# Patient Record
Sex: Male | Born: 1950 | Race: White | Hispanic: No | Marital: Married | Smoking: Former smoker
Health system: Southern US, Community
[De-identification: ages and names within clinical notes are randomized; demographics above are authoritative.]

## PROBLEM LIST (undated history)

## (undated) DIAGNOSIS — U071 COVID-19: Secondary | ICD-10-CM

## (undated) DIAGNOSIS — E785 Hyperlipidemia, unspecified: Secondary | ICD-10-CM

## (undated) DIAGNOSIS — H269 Unspecified cataract: Secondary | ICD-10-CM

## (undated) DIAGNOSIS — K317 Polyp of stomach and duodenum: Secondary | ICD-10-CM

## (undated) DIAGNOSIS — K219 Gastro-esophageal reflux disease without esophagitis: Secondary | ICD-10-CM

## (undated) DIAGNOSIS — N529 Male erectile dysfunction, unspecified: Secondary | ICD-10-CM

## (undated) DIAGNOSIS — L02211 Cutaneous abscess of abdominal wall: Secondary | ICD-10-CM

## (undated) DIAGNOSIS — Z862 Personal history of diseases of the blood and blood-forming organs and certain disorders involving the immune mechanism: Secondary | ICD-10-CM

## (undated) DIAGNOSIS — M1712 Unilateral primary osteoarthritis, left knee: Secondary | ICD-10-CM

## (undated) DIAGNOSIS — G4730 Sleep apnea, unspecified: Secondary | ICD-10-CM

## (undated) DIAGNOSIS — E119 Type 2 diabetes mellitus without complications: Secondary | ICD-10-CM

## (undated) DIAGNOSIS — K5790 Diverticulosis of intestine, part unspecified, without perforation or abscess without bleeding: Secondary | ICD-10-CM

## (undated) DIAGNOSIS — G4733 Obstructive sleep apnea (adult) (pediatric): Secondary | ICD-10-CM

## (undated) DIAGNOSIS — I77811 Abdominal aortic ectasia: Secondary | ICD-10-CM

## (undated) HISTORY — DX: Male erectile dysfunction, unspecified: N52.9

## (undated) HISTORY — DX: Polyp of stomach and duodenum: K31.7

## (undated) HISTORY — DX: Sleep apnea, unspecified: G47.30

## (undated) HISTORY — DX: Personal history of diseases of the blood and blood-forming organs and certain disorders involving the immune mechanism: Z86.2

## (undated) HISTORY — DX: Diverticulosis of intestine, part unspecified, without perforation or abscess without bleeding: K57.90

## (undated) HISTORY — DX: Unilateral primary osteoarthritis, left knee: M17.12

## (undated) HISTORY — DX: Gastro-esophageal reflux disease without esophagitis: K21.9

## (undated) HISTORY — PX: ABDOMINOPLASTY: SUR9

## (undated) HISTORY — DX: Abdominal aortic ectasia: I77.811

## (undated) HISTORY — DX: Unspecified cataract: H26.9

## (undated) HISTORY — DX: Hyperlipidemia, unspecified: E78.5

## (undated) HISTORY — PX: EYE SURGERY: SHX253

## (undated) HISTORY — PX: COSMETIC SURGERY: SHX468

## (undated) HISTORY — DX: Cutaneous abscess of abdominal wall: L02.211

## (undated) HISTORY — PX: HERNIA REPAIR: SHX51

## (undated) HISTORY — DX: Obstructive sleep apnea (adult) (pediatric): G47.33

## (undated) HISTORY — DX: COVID-19: U07.1

## (undated) HISTORY — PX: OTHER SURGICAL HISTORY: SHX169

## (undated) HISTORY — PX: TONSILLECTOMY: SUR1361

---

## 2011-01-04 DIAGNOSIS — IMO0002 Reserved for concepts with insufficient information to code with codable children: Secondary | ICD-10-CM | POA: Insufficient documentation

## 2011-01-04 DIAGNOSIS — E785 Hyperlipidemia, unspecified: Secondary | ICD-10-CM | POA: Insufficient documentation

## 2013-11-19 DIAGNOSIS — E1165 Type 2 diabetes mellitus with hyperglycemia: Secondary | ICD-10-CM | POA: Insufficient documentation

## 2013-11-19 DIAGNOSIS — IMO0002 Reserved for concepts with insufficient information to code with codable children: Secondary | ICD-10-CM | POA: Insufficient documentation

## 2013-11-20 DIAGNOSIS — E669 Obesity, unspecified: Secondary | ICD-10-CM | POA: Insufficient documentation

## 2017-06-03 HISTORY — PX: COLONOSCOPY: SHX174

## 2017-11-21 ENCOUNTER — Emergency Department (HOSPITAL_BASED_OUTPATIENT_CLINIC_OR_DEPARTMENT_OTHER): Payer: Managed Care, Other (non HMO)

## 2017-11-21 ENCOUNTER — Other Ambulatory Visit: Payer: Self-pay

## 2017-11-21 ENCOUNTER — Emergency Department (HOSPITAL_BASED_OUTPATIENT_CLINIC_OR_DEPARTMENT_OTHER)
Admission: EM | Admit: 2017-11-21 | Discharge: 2017-11-21 | Disposition: A | Discharge status: Home / Self Care | Payer: Managed Care, Other (non HMO) | Attending: Emergency Medicine | Admitting: Emergency Medicine

## 2017-11-21 ENCOUNTER — Encounter (HOSPITAL_BASED_OUTPATIENT_CLINIC_OR_DEPARTMENT_OTHER): Payer: Self-pay | Admitting: Emergency Medicine

## 2017-11-21 DIAGNOSIS — Y999 Unspecified external cause status: Secondary | ICD-10-CM | POA: Diagnosis not present

## 2017-11-21 DIAGNOSIS — S7012XA Contusion of left thigh, initial encounter: Secondary | ICD-10-CM | POA: Diagnosis not present

## 2017-11-21 DIAGNOSIS — M79605 Pain in left leg: Secondary | ICD-10-CM

## 2017-11-21 DIAGNOSIS — Y939 Activity, unspecified: Secondary | ICD-10-CM | POA: Insufficient documentation

## 2017-11-21 DIAGNOSIS — S79922A Unspecified injury of left thigh, initial encounter: Secondary | ICD-10-CM | POA: Diagnosis present

## 2017-11-21 DIAGNOSIS — E119 Type 2 diabetes mellitus without complications: Secondary | ICD-10-CM | POA: Diagnosis not present

## 2017-11-21 DIAGNOSIS — Y929 Unspecified place or not applicable: Secondary | ICD-10-CM | POA: Diagnosis not present

## 2017-11-21 DIAGNOSIS — Y33XXXA Other specified events, undetermined intent, initial encounter: Secondary | ICD-10-CM | POA: Diagnosis not present

## 2017-11-21 HISTORY — DX: Type 2 diabetes mellitus without complications: E11.9

## 2017-11-21 NOTE — Discharge Instructions (Addendum)
May use ibuprofen, naproxen, or Tylenol for pain. Keep the extremity elevated as often as possible. Apply warm compresses to the area throughout the day. Follow-up with a primary care provider for any further management of this issue.

## 2017-11-21 NOTE — ED Triage Notes (Signed)
L inner thigh pain x 3days. Denies injury.

## 2017-11-21 NOTE — ED Provider Notes (Signed)
[facility] EMERGENCY DEPARTMENT Provider Note   CSN: [id number] Arrival date & time: 11/21/17  1436     History   Chief Complaint Chief Complaint  Patient presents with  . Leg Pain    HPI Rosbel Buckner is a 67 y.o. male.  HPI   Rayhaan Huster is a 67 y.o. male, with a history of DM, presenting to the ED with an area of pain to the left distal inner thigh for about the past 3 days.  States, "maybe it is worsening or maybe I am just noticing it more now that I know about it.  I am not really worried about it, but my wife told me to come in."  Pain is minor, described as a soreness, nonradiating.  Denies fever, numbness, weakness, injury, chest pain, shortness of breath, other areas of bruising or pain, or any other complaints.  States he does frequently travel by air typically seated for 2 to 4 hours at a time.  Denies history of PE/DVT, recent trauma, recent surgery, hormone therapy.   Past Medical History:  Diagnosis Date  . Diabetes mellitus without complication ([place])     There are no active problems to display for this patient.   Past Surgical History:  Procedure Laterality Date  . HERNIA REPAIR    . TONSILLECTOMY          Home Medications    Prior to Admission medications   Not on File    Family History No family history on file.  Social History Social History   Tobacco Use  . Smoking status: Never Smoker  . Smokeless tobacco: Never Used  Substance Use Topics  . Alcohol use: Yes  . Drug use: Not Currently     Allergies   Patient has no known allergies.   Review of Systems Review of Systems  Constitutional: Negative for chills, diaphoresis and fever.  Respiratory: Negative for shortness of breath.   Cardiovascular: Negative for chest pain and leg swelling.  Gastrointestinal: Negative for nausea and vomiting.  Musculoskeletal: Positive for myalgias.  Skin: Positive for color change.  Neurological: Negative for weakness and numbness.    All other systems reviewed and are negative.    Physical Exam Updated Vital Signs BP 132/89 (BP Location: Left Arm)   Pulse 76   Temp 98.3 F (36.8 C) (Oral)   Resp 16   Ht 5\' 10"  (1.778 m)   Wt 106.6 kg (235 lb)   SpO2 98%   BMI 33.72 kg/m   Physical Exam  Constitutional: He appears well-developed and well-nourished. No distress.  HENT:  Head: Normocephalic and atraumatic.  Eyes: Conjunctivae are normal.  Neck: Neck supple.  Cardiovascular: Normal rate, regular rhythm and intact distal pulses.  Pulmonary/Chest: Effort normal. No respiratory distress.  Abdominal: There is no guarding.  Musculoskeletal: He exhibits tenderness. He exhibits no edema.       Legs: Approximately 4-5 cm circular region of bruising, slight tenderness, and slight firmness to the left posterior medial distal thigh.  No noted increased warmth, erythema, or fluctuance. Full motor function intact without difficulty in the left knee and hip. Patient ambulatory without gait deficit.  Lymphadenopathy:    He has no cervical adenopathy.  Neurological: He is alert.  Skin: Skin is warm and dry. He is not diaphoretic. No pallor.  Psychiatric: He has a normal mood and affect. His behavior is normal.  Nursing note and vitals reviewed.    ED Treatments / Results  Labs (all labs ordered  are listed, but only abnormal results are displayed) Labs Reviewed - No data to display  EKG None  Radiology US Venous Img Lower  Left (dvt Study)  Result Date: 11/21/2017 CLINICAL DATA:  Left lower extremity inner thigh pain for 3 days. Tenderness and a palpable mass is associated with the symptomatic area. EXAM: LEFT LOWER EXTREMITY VENOUS DUPLEX ULTRASOUND TECHNIQUE: Doppler venous assessment of the left lower extremity deep venous system was performed, including characterization of spectral flow, compressibility, and phasicity. COMPARISON:  None. FINDINGS: There is complete compressibility of the left common femoral,  femoral, and popliteal veins. Doppler analysis demonstrates respiratory phasicity and augmentation of flow with calf compression. No obvious superficial vein or calf vein thrombosis. IMPRESSION: No evidence of left lower extremity DVT. Electronically Signed   By: Marybelle Killings M.D.   On: 11/21/2017 16:02    Procedures Procedures (including critical care time)  Medications Ordered in ED Medications - No data to display   Initial Impression / Assessment and Plan / ED Course  I have reviewed the triage vital signs and the nursing notes.  Pertinent labs & imaging results that were available during my care of the patient were reviewed by me and considered in my medical decision making (see chart for details).     Patient presents with an area of bruising to the left medial thigh.  Discomfort is minor.  Duplex ultrasound negative for DVT.  Symptomatic care discussed. PCP follow-up as needed. The patient was given instructions for home care as well as return precautions. Patient voices understanding of these instructions, accepts the plan, and is comfortable with discharge.    Findings and plan of care discussed with Shirlyn Goltz, MD. Dr. Darl Householder personally evaluated and examined this patient.  Final Clinical Impressions(s) / ED Diagnoses   Final diagnoses:  Left leg pain    ED Discharge Orders    None       Layla Maw 11/21/17 1715    Drenda Freeze, MD 11/21/17 [phone]

## 2018-03-11 ENCOUNTER — Emergency Department (HOSPITAL_COMMUNITY): Payer: Managed Care, Other (non HMO)

## 2018-03-11 ENCOUNTER — Inpatient Hospital Stay (HOSPITAL_COMMUNITY)
Admission: EM | Admit: 2018-03-11 | Discharge: 2018-03-13 | DRG: 419 | Disposition: A | Discharge status: Home / Self Care | Payer: Managed Care, Other (non HMO) | Attending: General Surgery | Admitting: General Surgery

## 2018-03-11 ENCOUNTER — Other Ambulatory Visit: Payer: Self-pay

## 2018-03-11 ENCOUNTER — Encounter (HOSPITAL_COMMUNITY): Payer: Self-pay | Admitting: Emergency Medicine

## 2018-03-11 DIAGNOSIS — R1011 Right upper quadrant pain: Secondary | ICD-10-CM | POA: Diagnosis not present

## 2018-03-11 DIAGNOSIS — K8000 Calculus of gallbladder with acute cholecystitis without obstruction: Principal | ICD-10-CM | POA: Diagnosis present

## 2018-03-11 DIAGNOSIS — K819 Cholecystitis, unspecified: Secondary | ICD-10-CM

## 2018-03-11 DIAGNOSIS — K82A1 Gangrene of gallbladder in cholecystitis: Secondary | ICD-10-CM | POA: Diagnosis present

## 2018-03-11 DIAGNOSIS — Z6834 Body mass index (BMI) 34.0-34.9, adult: Secondary | ICD-10-CM

## 2018-03-11 DIAGNOSIS — E669 Obesity, unspecified: Secondary | ICD-10-CM | POA: Diagnosis present

## 2018-03-11 DIAGNOSIS — K5900 Constipation, unspecified: Secondary | ICD-10-CM | POA: Diagnosis present

## 2018-03-11 DIAGNOSIS — K810 Acute cholecystitis: Secondary | ICD-10-CM | POA: Diagnosis present

## 2018-03-11 DIAGNOSIS — E860 Dehydration: Secondary | ICD-10-CM | POA: Diagnosis present

## 2018-03-11 DIAGNOSIS — E119 Type 2 diabetes mellitus without complications: Secondary | ICD-10-CM | POA: Diagnosis present

## 2018-03-11 LAB — CBC WITH DIFFERENTIAL/PLATELET
ABS IMMATURE GRANULOCYTES: 0.12 10*3/uL — AB (ref 0.00–0.07)
BASOS PCT: 0 %
Basophils Absolute: 0 10*3/uL (ref 0.0–0.1)
EOS ABS: 0 10*3/uL (ref 0.0–0.5)
EOS PCT: 0 %
HCT: 44.3 % (ref 39.0–52.0)
Hemoglobin: 14.3 g/dL (ref 13.0–17.0)
Immature Granulocytes: 1 %
Lymphocytes Relative: 6 %
Lymphs Abs: 1.2 10*3/uL (ref 0.7–4.0)
MCH: 26 pg (ref 26.0–34.0)
MCHC: 32.3 g/dL (ref 30.0–36.0)
MCV: 80.5 fL (ref 80.0–100.0)
MONO ABS: 1.4 10*3/uL — AB (ref 0.1–1.0)
Monocytes Relative: 7 %
NEUTROS ABS: 17.1 10*3/uL — AB (ref 1.7–7.7)
Neutrophils Relative %: 86 %
PLATELETS: 306 10*3/uL (ref 150–400)
RBC: 5.5 MIL/uL (ref 4.22–5.81)
RDW: 14.8 % (ref 11.5–15.5)
WBC: 19.8 10*3/uL — ABNORMAL HIGH (ref 4.0–10.5)
nRBC: 0 % (ref 0.0–0.2)

## 2018-03-11 LAB — LIPASE, BLOOD: LIPASE: 28 U/L (ref 11–51)

## 2018-03-11 LAB — COMPREHENSIVE METABOLIC PANEL
ALT: 25 U/L (ref 0–44)
AST: 23 U/L (ref 15–41)
Albumin: 4.2 g/dL (ref 3.5–5.0)
Alkaline Phosphatase: 70 U/L (ref 38–126)
Anion gap: 13 (ref 5–15)
BUN: 11 mg/dL (ref 8–23)
CHLORIDE: 99 mmol/L (ref 98–111)
CO2: 23 mmol/L (ref 22–32)
CREATININE: 0.78 mg/dL (ref 0.61–1.24)
Calcium: 9.6 mg/dL (ref 8.9–10.3)
GFR calc non Af Amer: >60 mL/min (ref >60)
Glucose, Bld: 163 mg/dL — ABNORMAL HIGH (ref 70–99)
POTASSIUM: 3.4 mmol/L — AB (ref 3.5–5.1)
Sodium: 135 mmol/L (ref 135–145)
Total Bilirubin: 1.3 mg/dL — ABNORMAL HIGH (ref 0.3–1.2)
Total Protein: 7.5 g/dL (ref 6.5–8.1)

## 2018-03-11 LAB — URINALYSIS, ROUTINE W REFLEX MICROSCOPIC
BILIRUBIN URINE: NEGATIVE
GLUCOSE, UA: NEGATIVE mg/dL
Hgb urine dipstick: NEGATIVE
KETONES UR: NEGATIVE mg/dL
Leukocytes, UA: NEGATIVE
Nitrite: NEGATIVE
PH: 6 (ref 5.0–8.0)
Protein, ur: NEGATIVE mg/dL
SPECIFIC GRAVITY, URINE: 1.018 (ref 1.005–1.030)

## 2018-03-11 LAB — PROTIME-INR
INR: 1.22
PROTHROMBIN TIME: 15.3 s — AB (ref 11.4–15.2)

## 2018-03-11 LAB — I-STAT CG4 LACTIC ACID, ED: LACTIC ACID, VENOUS: 1.62 mmol/L (ref 0.5–1.9)

## 2018-03-11 MED ORDER — IOHEXOL 300 MG/ML  SOLN
100.0000 mL | Freq: Once | INTRAMUSCULAR | Status: AC | PRN
  Administered 2018-03-11: 100 mL via INTRAVENOUS

## 2018-03-11 NOTE — ED Provider Notes (Signed)
[facility]   CSN: [id number] Arrival date & time: 03/11/18  4818     History   Chief Complaint Chief Complaint  Patient presents with  . Abdominal Pain    HPI Alwaleed Obeso is a 67 y.o. male.  Patient sent to the [facility] from primary care doctor's office for evaluation of fever and right upper quadrant abdominal pain.  Patient reports that symptoms began approximately 24 hours ago.  He has been having continuous right upper quadrant abdominal pain since it started.  He reports that he has had intermittent pains in the area over the years and was told by a doctor at some point that it might be his gallbladder, but never pursued it.     Past Medical History:  Diagnosis Date  . Diabetes mellitus without complication ([place])     There are no active problems to display for this patient.   Past Surgical History:  Procedure Laterality Date  . HERNIA REPAIR    . TONSILLECTOMY          Home Medications    Prior to Admission medications   Not on File    Family History No family history on file.  Social History Social History   Tobacco Use  . Smoking status: Never Smoker  . Smokeless tobacco: Never Used  Substance Use Topics  . Alcohol use: Yes  . Drug use: Not Currently     Allergies   Patient has no known allergies.   Review of Systems Review of Systems  Constitutional: Positive for chills and fever.  Gastrointestinal: Positive for abdominal pain.  All other systems reviewed and are negative.    Physical Exam Updated Vital Signs BP (!) 129/91 (BP Location: Left Arm)   Pulse (!) 116   Temp 99 F (37.2 C) (Oral)   Resp 18   Ht 5\' 10"  (1.778 m)   Wt 109.3 kg   SpO2 95%   BMI 34.58 kg/m   Physical Exam  Constitutional: He is oriented to person, place, and time. He appears well-developed and well-nourished. No distress.  HENT:  Head: Normocephalic and atraumatic.  Right Ear:  Hearing normal.  Left Ear: Hearing normal.  Nose: Nose normal.  Mouth/Throat: Oropharynx is clear and moist and mucous membranes are normal.  Eyes: Pupils are equal, round, and reactive to light. Conjunctivae and EOM are normal.  Neck: Normal range of motion. Neck supple.  Cardiovascular: Regular rhythm, S1 normal and S2 normal. Exam reveals no gallop and no friction rub.  No murmur heard. Pulmonary/Chest: Effort normal and breath sounds normal. No respiratory distress. He exhibits no tenderness.  Abdominal: Soft. Normal appearance and bowel sounds are normal. There is no hepatosplenomegaly. There is tenderness in the right upper quadrant. There is guarding and positive Murphy's sign. There is no rebound and no tenderness at McBurney's point. No hernia.  Musculoskeletal: Normal range of motion.  Neurological: He is alert and oriented to person, place, and time. He has normal strength. No cranial nerve deficit or sensory deficit. Coordination normal. GCS eye subscore is 4. GCS verbal subscore is 5. GCS motor subscore is 6.  Skin: Skin is warm, dry and intact. No rash noted. No cyanosis.  Psychiatric: He has a normal mood and affect. His speech is normal and behavior is normal. Thought content normal.  Nursing note and vitals reviewed.    ED Treatments / Results  Labs (all labs ordered are listed, but only abnormal results are displayed)  Labs Reviewed  COMPREHENSIVE METABOLIC PANEL - Abnormal; Notable for the following components:      Result Value   Potassium 3.4 (*)    Glucose, Bld 163 (*)    Total Bilirubin 1.3 (*)    All other components within normal limits  CBC WITH DIFFERENTIAL/PLATELET - Abnormal; Notable for the following components:   WBC 19.8 (*)    Neutro Abs 17.1 (*)    Monocytes Absolute 1.4 (*)    Abs Immature Granulocytes 0.12 (*)    All other components within normal limits  PROTIME-INR - Abnormal; Notable for the following components:   Prothrombin Time 15.3 (*)     All other components within normal limits  CULTURE, BLOOD (ROUTINE X 2)  CULTURE, BLOOD (ROUTINE X 2)  URINALYSIS, ROUTINE W REFLEX MICROSCOPIC  LIPASE, BLOOD  I-STAT CG4 LACTIC ACID, ED  I-STAT CG4 LACTIC ACID, ED    EKG None  Radiology Ct Abdomen Pelvis W Contrast  Result Date: 03/11/2018 CLINICAL DATA:  Right upper quadrant pain starting yesterday. Nausea, fever, loss of appetite, difficulty defecating. EXAM: CT ABDOMEN AND PELVIS WITH CONTRAST TECHNIQUE: Multidetector CT imaging of the abdomen and pelvis was performed using the standard protocol following bolus administration of intravenous contrast. CONTRAST:  15mL OMNIPAQUE IOHEXOL 300 MG/ML  SOLN COMPARISON:  None. FINDINGS: Lower chest: Linear atelectasis in the right lung base. Small esophageal hiatal hernia. Hepatobiliary: Mild diffuse fatty infiltration of the liver. Circumscribed low-attenuation lesion in segment 6 is likely a small cyst. Additional subcentimeter lesions too small to characterize, also likely cysts or hemangiomas. Cholelithiasis with several stones in the gallbladder. Gallbladder wall is thickened and edematous with mild infiltration in the surrounding fat consistent with acute cholecystitis. Bile ducts are not dilated. Pancreas: Unremarkable. No pancreatic ductal dilatation or surrounding inflammatory changes. Spleen: Normal in size without focal abnormality. Adrenals/Urinary Tract: Adrenal glands are unremarkable. Kidneys are normal, without renal calculi, focal lesion, or hydronephrosis. Bladder is unremarkable. Stomach/Bowel: Stomach and small bowel are mostly decompressed. Scattered stool throughout the colon. No colonic distention or wall thickening. Diverticulosis of the sigmoid colon without evidence of diverticulitis. Appendix is normal. Vascular/Lymphatic: Aortic atherosclerosis. No enlarged abdominal or pelvic lymph nodes. Reproductive: Prostate is unremarkable. Other: Postoperative changes consistent with  bilateral inguinal hernia repairs and anterior abdominal wall mesh. Small periumbilical hernias containing fat. No free air or free fluid in the abdomen. Musculoskeletal: Degenerative changes in the spine. No destructive bone lesions. IMPRESSION: Cholelithiasis with inflammatory changes in the gallbladder consistent with acute cholecystitis. Diffuse fatty infiltration of the liver. Probable hepatic cysts. Small esophageal hiatal hernia. Diverticulosis of the colon without evidence of diverticulitis. Small periumbilical hernia containing fat. Aortic Atherosclerosis (ICD10-I70.0). Electronically Signed   By: Lucienne Capers M.D.   On: 03/11/2018 23:05    Procedures Procedures (including critical care time)  Medications Ordered in ED Medications  sodium chloride 0.9 % bolus 1,000 mL (has no administration in time range)  cefTRIAXone (ROCEPHIN) 2 g in sodium chloride 0.9 % 100 mL IVPB (has no administration in time range)  iohexol (OMNIPAQUE) 300 MG/ML solution 100 mL (100 mLs Intravenous Contrast Given 03/11/18 2226)     Initial Impression / Assessment and Plan / ED Course  I have reviewed the triage vital signs and the nursing notes.  Pertinent labs & imaging results that were available during my care of the patient were reviewed by me and considered in my medical decision making (see chart for details).    Patient presents for evaluation of  abdominal pain.  Patient has been experiencing persistent right upper quadrant abdominal pain with fever for 24 hours.  Examination reveals tenderness in the right upper quadrant with guarding and a positive Murphy sign.  He does have significant leukocytosis.  LFTs, lipase are normal.  Patient afebrile here in the ER, but did have a temperature of 100.5 document of by his primary care doctor earlier today.  CT scan does show evidence of acute cholecystitis.  Patient is slightly tachycardic, but does not have any fever here in the ER and lactic acid is normal.   He was empirically treated with Rocephin 2 g IV.  Discussed with Dr. Brantley Stage, on call for general surgery. He will see patient.  Final Clinical Impressions(s) / ED Diagnoses   Final diagnoses:  Cholecystitis    ED Discharge Orders    None       Numair Masden, Gwenyth Allegra, MD 03/12/18 [phone]

## 2018-03-11 NOTE — ED Provider Notes (Signed)
MSE was initiated and I personally evaluated the patient and placed orders (if any) at  8:41 PM on March 11, 2018.  The patient appears stable so that the remainder of the MSE may be completed by another provider.  Patient placed in Quick Look pathway, seen and evaluated   Chief Complaint: Abdominal pain  HPI:   67 year old male with past medical history of diabetes presents to ED for evaluation of right upper quadrant abdominal pain that began yesterday.  States that he has had intermittent pain for the past several years and was told that it could be due to his gallbladder.  However, he has not pursued any further treatment.  Symptoms began yesterday after he ate some peanuts.  He reports nausea but denies any vomiting.  Reports fever with T-max 101 today.  Did not take any antipyretics.  Seen and evaluated by PCP earlier today and was sent for further evaluation and CT scan.  His last bowel movement was yesterday and he states that "that is constipation for me."  Denies any sick contacts with similar symptoms, urinary symptoms, chest pain, shortness of breath. PCP notes show tachycardia at 127, temperature 100.5 in the office.  He was told to come to the ED earlier today but EMS transport. Wanted his wife to drive him.  ROS: abdominal pain (one)  Physical Exam:   Gen: No distress  Neuro: Awake and Alert  Skin: Warm    Focused Exam: Tenderness palpation of the right upper quadrant without rebound or guarding noted. No CVA tenderness bilaterally. Tachycardic.  8:44 PM Informed RN that patient needs room soon due to leukocytosis in PCP office (16), fever, tachycardia and possible intraabdominal infection.  Initiation of care has begun. The patient has been counseled on the process, plan, and necessity for staying for the completion/evaluation, and the remainder of the medical screening examination    Delia Heady, PA-C 03/11/18 2045    Deno Etienne, DO 03/12/18 0003

## 2018-03-11 NOTE — ED Triage Notes (Signed)
Pt reports RUQ abd pain that started yesterday. Pt's doctor called and told him to come here because "the infection is up." Pt reports nausea, fevers, loss of appetite, and unable to defecate. Last BM yesterday. Fever of 101F earlier today. Pt reports not taking anything for fevers. 99.69F in triage.

## 2018-03-12 ENCOUNTER — Other Ambulatory Visit: Payer: Self-pay

## 2018-03-12 ENCOUNTER — Inpatient Hospital Stay (HOSPITAL_COMMUNITY): Payer: Managed Care, Other (non HMO) | Admitting: Certified Registered Nurse Anesthetist

## 2018-03-12 ENCOUNTER — Encounter (HOSPITAL_COMMUNITY): Payer: Self-pay | Admitting: Certified Registered Nurse Anesthetist

## 2018-03-12 ENCOUNTER — Encounter (HOSPITAL_COMMUNITY): Admission: EM | Disposition: A | Discharge status: Home / Self Care | Payer: Self-pay | Source: Home / Self Care

## 2018-03-12 DIAGNOSIS — E669 Obesity, unspecified: Secondary | ICD-10-CM | POA: Diagnosis present

## 2018-03-12 DIAGNOSIS — I951 Orthostatic hypotension: Secondary | ICD-10-CM

## 2018-03-12 DIAGNOSIS — R000 Tachycardia, unspecified: Secondary | ICD-10-CM | POA: Diagnosis not present

## 2018-03-12 DIAGNOSIS — R1011 Right upper quadrant pain: Secondary | ICD-10-CM | POA: Diagnosis present

## 2018-03-12 DIAGNOSIS — K82A1 Gangrene of gallbladder in cholecystitis: Secondary | ICD-10-CM | POA: Diagnosis present

## 2018-03-12 DIAGNOSIS — K810 Acute cholecystitis: Secondary | ICD-10-CM | POA: Diagnosis present

## 2018-03-12 DIAGNOSIS — E119 Type 2 diabetes mellitus without complications: Secondary | ICD-10-CM

## 2018-03-12 DIAGNOSIS — K5900 Constipation, unspecified: Secondary | ICD-10-CM | POA: Diagnosis present

## 2018-03-12 DIAGNOSIS — K8000 Calculus of gallbladder with acute cholecystitis without obstruction: Secondary | ICD-10-CM | POA: Diagnosis present

## 2018-03-12 DIAGNOSIS — E860 Dehydration: Secondary | ICD-10-CM | POA: Diagnosis present

## 2018-03-12 DIAGNOSIS — Z6834 Body mass index (BMI) 34.0-34.9, adult: Secondary | ICD-10-CM | POA: Diagnosis not present

## 2018-03-12 HISTORY — PX: CHOLECYSTECTOMY: SHX55

## 2018-03-12 HISTORY — PX: ABDOMINAL HERNIA REPAIR: SHX539

## 2018-03-12 LAB — LIPID PANEL
CHOLESTEROL: 152 mg/dL (ref 0–200)
HDL: 48 mg/dL (ref >40)
LDL Cholesterol: 86 mg/dL (ref 0–99)
Total CHOL/HDL Ratio: 3.2 RATIO
Triglycerides: 88 mg/dL (ref <150)
VLDL: 18 mg/dL (ref 0–40)

## 2018-03-12 LAB — GLUCOSE, CAPILLARY
GLUCOSE-CAPILLARY: 150 mg/dL — AB (ref 70–99)
GLUCOSE-CAPILLARY: 146 mg/dL — AB (ref 70–99)
GLUCOSE-CAPILLARY: 183 mg/dL — AB (ref 70–99)
Glucose-Capillary: 139 mg/dL — ABNORMAL HIGH (ref 70–99)
Glucose-Capillary: 147 mg/dL — ABNORMAL HIGH (ref 70–99)
Glucose-Capillary: 242 mg/dL — ABNORMAL HIGH (ref 70–99)

## 2018-03-12 LAB — SURGICAL PCR SCREEN
MRSA, PCR: NEGATIVE
Staphylococcus aureus: POSITIVE — AB

## 2018-03-12 LAB — HEMOGLOBIN A1C
HEMOGLOBIN A1C: 6.3 % — AB (ref 4.8–5.6)
MEAN PLASMA GLUCOSE: 134.11 mg/dL

## 2018-03-12 LAB — TROPONIN I
Troponin I: <0.03 ng/mL (ref <0.03)
Troponin I: <0.03 ng/mL (ref <0.03)

## 2018-03-12 LAB — HIV ANTIBODY (ROUTINE TESTING W REFLEX): HIV SCREEN 4TH GENERATION: NONREACTIVE

## 2018-03-12 SURGERY — LAPAROSCOPIC CHOLECYSTECTOMY WITH INTRAOPERATIVE CHOLANGIOGRAM
Anesthesia: General | Site: Abdomen

## 2018-03-12 MED ORDER — MUPIROCIN 2 % EX OINT
TOPICAL_OINTMENT | Freq: Two times a day (BID) | CUTANEOUS | Status: DC
  Administered 2018-03-12: 10:00:00 via NASAL
  Filled 2018-03-12: qty 22

## 2018-03-12 MED ORDER — KETOROLAC TROMETHAMINE 30 MG/ML IJ SOLN
INTRAMUSCULAR | Status: AC
  Filled 2018-03-12: qty 1

## 2018-03-12 MED ORDER — SODIUM CHLORIDE 0.9 % IV SOLN
2.0000 g | Freq: Once | INTRAVENOUS | Status: AC
  Administered 2018-03-12: 2 g via INTRAVENOUS
  Filled 2018-03-12: qty 20

## 2018-03-12 MED ORDER — LIDOCAINE 2% (20 MG/ML) 5 ML SYRINGE
INTRAMUSCULAR | Status: AC
  Filled 2018-03-12: qty 5

## 2018-03-12 MED ORDER — SODIUM CHLORIDE 0.9 % IR SOLN
Status: DC | PRN
  Administered 2018-03-12: 3000 mL
  Administered 2018-03-12 (×2): 1000 mL

## 2018-03-12 MED ORDER — SODIUM CHLORIDE 0.9 % IV BOLUS
1000.0000 mL | Freq: Once | INTRAVENOUS | Status: AC
  Administered 2018-03-12: 1000 mL via INTRAVENOUS

## 2018-03-12 MED ORDER — FENTANYL CITRATE (PF) 250 MCG/5ML IJ SOLN
INTRAMUSCULAR | Status: AC
  Filled 2018-03-12: qty 5

## 2018-03-12 MED ORDER — OXYCODONE HCL 5 MG PO TABS
5.0000 mg | ORAL_TABLET | ORAL | Status: DC | PRN
  Administered 2018-03-12 – 2018-03-13 (×3): 10 mg via ORAL
  Filled 2018-03-12 (×3): qty 2

## 2018-03-12 MED ORDER — ONDANSETRON 4 MG PO TBDP: 4.0000 mg | ORAL_TABLET | Freq: Four times a day (QID) | ORAL | Status: DC | PRN

## 2018-03-12 MED ORDER — MEPERIDINE HCL 50 MG/ML IJ SOLN: 6.2500 mg | INTRAMUSCULAR | Status: DC | PRN

## 2018-03-12 MED ORDER — ENOXAPARIN SODIUM 40 MG/0.4ML ~~LOC~~ SOLN: 40.0000 mg | SUBCUTANEOUS | Status: DC

## 2018-03-12 MED ORDER — PROPOFOL 10 MG/ML IV BOLUS
INTRAVENOUS | Status: DC | PRN
  Administered 2018-03-12: 200 mg via INTRAVENOUS

## 2018-03-12 MED ORDER — DEXAMETHASONE SODIUM PHOSPHATE 10 MG/ML IJ SOLN
INTRAMUSCULAR | Status: AC
  Filled 2018-03-12: qty 1

## 2018-03-12 MED ORDER — DIPHENHYDRAMINE HCL 50 MG/ML IJ SOLN: 25.0000 mg | Freq: Four times a day (QID) | INTRAMUSCULAR | Status: DC | PRN

## 2018-03-12 MED ORDER — PROPOFOL 10 MG/ML IV BOLUS
INTRAVENOUS | Status: AC
  Filled 2018-03-12: qty 20

## 2018-03-12 MED ORDER — ROCURONIUM BROMIDE 50 MG/5ML IV SOSY
PREFILLED_SYRINGE | INTRAVENOUS | Status: AC
  Filled 2018-03-12: qty 5

## 2018-03-12 MED ORDER — DEXAMETHASONE SODIUM PHOSPHATE 10 MG/ML IJ SOLN
INTRAMUSCULAR | Status: DC | PRN
  Administered 2018-03-12: 10 mg via INTRAVENOUS

## 2018-03-12 MED ORDER — ONDANSETRON HCL 4 MG/2ML IJ SOLN
INTRAMUSCULAR | Status: AC
  Filled 2018-03-12: qty 2

## 2018-03-12 MED ORDER — HYDROMORPHONE HCL 1 MG/ML IJ SOLN
1.0000 mg | INTRAMUSCULAR | Status: DC | PRN
  Filled 2018-03-12: qty 1

## 2018-03-12 MED ORDER — HYDRALAZINE HCL 20 MG/ML IJ SOLN: 10.0000 mg | INTRAMUSCULAR | Status: DC | PRN

## 2018-03-12 MED ORDER — PHENYLEPHRINE 40 MCG/ML (10ML) SYRINGE FOR IV PUSH (FOR BLOOD PRESSURE SUPPORT)
PREFILLED_SYRINGE | INTRAVENOUS | Status: DC | PRN
  Administered 2018-03-12 (×2): 40 ug via INTRAVENOUS

## 2018-03-12 MED ORDER — INSULIN ASPART 100 UNIT/ML ~~LOC~~ SOLN
0.0000 [IU] | SUBCUTANEOUS | Status: DC
  Administered 2018-03-12: 3 [IU] via SUBCUTANEOUS
  Administered 2018-03-12: 2 [IU] via SUBCUTANEOUS

## 2018-03-12 MED ORDER — ONDANSETRON HCL 4 MG/2ML IJ SOLN: 4.0000 mg | Freq: Four times a day (QID) | INTRAMUSCULAR | Status: DC | PRN

## 2018-03-12 MED ORDER — HYDROMORPHONE HCL 1 MG/ML IJ SOLN: 0.2500 mg | INTRAMUSCULAR | Status: DC | PRN

## 2018-03-12 MED ORDER — PHENYLEPHRINE 40 MCG/ML (10ML) SYRINGE FOR IV PUSH (FOR BLOOD PRESSURE SUPPORT)
PREFILLED_SYRINGE | INTRAVENOUS | Status: AC
  Filled 2018-03-12: qty 10

## 2018-03-12 MED ORDER — ONDANSETRON HCL 4 MG/2ML IJ SOLN
INTRAMUSCULAR | Status: DC | PRN
  Administered 2018-03-12: 4 mg via INTRAVENOUS

## 2018-03-12 MED ORDER — BUPIVACAINE-EPINEPHRINE 0.25% -1:200000 IJ SOLN
INTRAMUSCULAR | Status: DC | PRN
  Administered 2018-03-12: 15 mL

## 2018-03-12 MED ORDER — MIDAZOLAM HCL 5 MG/5ML IJ SOLN
INTRAMUSCULAR | Status: DC | PRN
  Administered 2018-03-12: 2 mg via INTRAVENOUS

## 2018-03-12 MED ORDER — PROMETHAZINE HCL 25 MG/ML IJ SOLN: 6.2500 mg | INTRAMUSCULAR | Status: DC | PRN

## 2018-03-12 MED ORDER — ROCURONIUM BROMIDE 10 MG/ML (PF) SYRINGE
PREFILLED_SYRINGE | INTRAVENOUS | Status: DC | PRN
  Administered 2018-03-12: 50 mg via INTRAVENOUS
  Administered 2018-03-12: 5 mg via INTRAVENOUS

## 2018-03-12 MED ORDER — KETOROLAC TROMETHAMINE 30 MG/ML IJ SOLN: 30.0000 mg | Freq: Once | INTRAMUSCULAR | Status: DC | PRN

## 2018-03-12 MED ORDER — SUGAMMADEX SODIUM 200 MG/2ML IV SOLN
INTRAVENOUS | Status: DC | PRN
  Administered 2018-03-12: 220.2 mg via INTRAVENOUS

## 2018-03-12 MED ORDER — FENTANYL CITRATE (PF) 100 MCG/2ML IJ SOLN
INTRAMUSCULAR | Status: DC | PRN
  Administered 2018-03-12 (×2): 50 ug via INTRAVENOUS
  Administered 2018-03-12 (×3): 100 ug via INTRAVENOUS

## 2018-03-12 MED ORDER — HEMOSTATIC AGENTS (NO CHARGE) OPTIME
TOPICAL | Status: DC | PRN
  Administered 2018-03-12 (×2): 1 via TOPICAL

## 2018-03-12 MED ORDER — PANTOPRAZOLE SODIUM 40 MG PO TBEC
40.0000 mg | DELAYED_RELEASE_TABLET | Freq: Every day | ORAL | Status: DC
  Administered 2018-03-12: 40 mg via ORAL
  Filled 2018-03-12: qty 1

## 2018-03-12 MED ORDER — MIDAZOLAM HCL 2 MG/2ML IJ SOLN
INTRAMUSCULAR | Status: AC
  Filled 2018-03-12: qty 2

## 2018-03-12 MED ORDER — PIPERACILLIN-TAZOBACTAM 3.375 G IVPB 30 MIN
3.3750 g | INTRAVENOUS | Status: AC
  Administered 2018-03-12: 3.375 g via INTRAVENOUS
  Filled 2018-03-12: qty 50

## 2018-03-12 MED ORDER — LIDOCAINE 2% (20 MG/ML) 5 ML SYRINGE
INTRAMUSCULAR | Status: DC | PRN
  Administered 2018-03-12: 100 mg via INTRAVENOUS

## 2018-03-12 MED ORDER — ALBUMIN HUMAN 5 % IV SOLN
INTRAVENOUS | Status: DC | PRN
  Administered 2018-03-12: 11:00:00 via INTRAVENOUS

## 2018-03-12 MED ORDER — LACTATED RINGERS IV SOLN
INTRAVENOUS | Status: DC
  Administered 2018-03-12 (×3): via INTRAVENOUS

## 2018-03-12 MED ORDER — PIPERACILLIN-TAZOBACTAM 3.375 G IVPB
3.3750 g | Freq: Three times a day (TID) | INTRAVENOUS | Status: DC
  Administered 2018-03-12 – 2018-03-13 (×2): 3.375 g via INTRAVENOUS
  Filled 2018-03-12 (×2): qty 50

## 2018-03-12 MED ORDER — 0.9 % SODIUM CHLORIDE (POUR BTL) OPTIME
TOPICAL | Status: DC | PRN
  Administered 2018-03-12: 1000 mL

## 2018-03-12 MED ORDER — HYDROMORPHONE HCL 1 MG/ML IJ SOLN
1.0000 mg | Freq: Once | INTRAMUSCULAR | Status: AC
  Administered 2018-03-12: 1 mg via INTRAVENOUS
  Filled 2018-03-12: qty 1

## 2018-03-12 MED ORDER — ACETAMINOPHEN 500 MG PO TABS
1000.0000 mg | ORAL_TABLET | Freq: Three times a day (TID) | ORAL | Status: DC
  Administered 2018-03-12 – 2018-03-13 (×3): 1000 mg via ORAL
  Filled 2018-03-12 (×3): qty 2

## 2018-03-12 MED ORDER — ONDANSETRON HCL 4 MG/2ML IJ SOLN
4.0000 mg | Freq: Once | INTRAMUSCULAR | Status: AC
  Administered 2018-03-12: 4 mg via INTRAVENOUS
  Filled 2018-03-12: qty 2

## 2018-03-12 MED ORDER — DEXTROSE-NACL 5-0.9 % IV SOLN
INTRAVENOUS | Status: DC
  Administered 2018-03-12 (×2): via INTRAVENOUS

## 2018-03-12 MED ORDER — SODIUM CHLORIDE 0.9 % IV SOLN
2.0000 g | INTRAVENOUS | Status: DC
  Filled 2018-03-12: qty 20

## 2018-03-12 SURGICAL SUPPLY — 49 items
APPLIER CLIP 5 13 M/L LIGAMAX5 (MISCELLANEOUS) ×2
BIOPATCH RED 1 DISK 7.0 (GAUZE/BANDAGES/DRESSINGS) ×2 IMPLANT
BLADE CLIPPER SURG (BLADE) ×2 IMPLANT
CANISTER SUCT 3000ML PPV (MISCELLANEOUS) ×4 IMPLANT
CHLORAPREP W/TINT 26ML (MISCELLANEOUS) ×2 IMPLANT
CLIP APPLIE 5 13 M/L LIGAMAX5 (MISCELLANEOUS) ×1 IMPLANT
COVER MAYO STAND STRL (DRAPES) IMPLANT
COVER SURGICAL LIGHT HANDLE (MISCELLANEOUS) ×2 IMPLANT
COVER WAND RF STERILE (DRAPES) IMPLANT
CUTTER FLEX LINEAR 45M (STAPLE) ×2 IMPLANT
DERMABOND ADVANCED (GAUZE/BANDAGES/DRESSINGS) ×1
DERMABOND ADVANCED .7 DNX12 (GAUZE/BANDAGES/DRESSINGS) ×1 IMPLANT
DRAIN CHANNEL 19F RND (DRAIN) ×2 IMPLANT
DRAPE C-ARM 42X72 X-RAY (DRAPES) IMPLANT
DRSG TEGADERM 4X4.75 (GAUZE/BANDAGES/DRESSINGS) ×2 IMPLANT
ELECT REM PT RETURN 9FT ADLT (ELECTROSURGICAL) ×2
ELECTRODE REM PT RTRN 9FT ADLT (ELECTROSURGICAL) ×1 IMPLANT
EVACUATOR SILICONE 100CC (DRAIN) ×2 IMPLANT
GLOVE BIO SURGEON STRL SZ7 (GLOVE) ×2 IMPLANT
GLOVE BIOGEL PI IND STRL 7.5 (GLOVE) ×1 IMPLANT
GLOVE BIOGEL PI INDICATOR 7.5 (GLOVE) ×1
GOWN STRL REUS W/ TWL LRG LVL3 (GOWN DISPOSABLE) ×3 IMPLANT
GOWN STRL REUS W/TWL LRG LVL3 (GOWN DISPOSABLE) ×3
GRASPER SUT TROCAR 14GX15 (MISCELLANEOUS) ×2 IMPLANT
HEMOSTAT SNOW SURGICEL 2X4 (HEMOSTASIS) ×4 IMPLANT
KIT BASIN OR (CUSTOM PROCEDURE TRAY) ×2 IMPLANT
KIT TURNOVER KIT B (KITS) ×2 IMPLANT
NS IRRIG 1000ML POUR BTL (IV SOLUTION) ×2 IMPLANT
PAD ARMBOARD 7.5X6 YLW CONV (MISCELLANEOUS) ×4 IMPLANT
POUCH RETRIEVAL ECOSAC 10 (ENDOMECHANICALS) ×1 IMPLANT
POUCH RETRIEVAL ECOSAC 10MM (ENDOMECHANICALS) ×1
RELOAD STAPLE TA45 3.5 REG BLU (ENDOMECHANICALS) ×4 IMPLANT
SCISSORS LAP 5X35 DISP (ENDOMECHANICALS) ×2 IMPLANT
SET CHOLANGIOGRAPH 5 50 .035 (SET/KITS/TRAYS/PACK) IMPLANT
SET IRRIG TUBING LAPAROSCOPIC (IRRIGATION / IRRIGATOR) ×2 IMPLANT
SLEEVE ENDOPATH XCEL 5M (ENDOMECHANICALS) ×6 IMPLANT
SPECIMEN JAR SMALL (MISCELLANEOUS) ×2 IMPLANT
STRIP CLOSURE SKIN 1/2X4 (GAUZE/BANDAGES/DRESSINGS) ×2 IMPLANT
SUT ETHILON 2 0 FS 18 (SUTURE) ×2 IMPLANT
SUT MNCRL AB 4-0 PS2 18 (SUTURE) ×4 IMPLANT
SUT VICRYL 0 UR6 27IN ABS (SUTURE) ×4 IMPLANT
TOWEL OR 17X24 6PK STRL BLUE (TOWEL DISPOSABLE) ×2 IMPLANT
TOWEL OR 17X26 10 PK STRL BLUE (TOWEL DISPOSABLE) ×2 IMPLANT
TRAY LAPAROSCOPIC MC (CUSTOM PROCEDURE TRAY) ×2 IMPLANT
TROCAR XCEL 12X100 BLDLESS (ENDOMECHANICALS) ×2 IMPLANT
TROCAR XCEL BLUNT TIP 100MML (ENDOMECHANICALS) ×2 IMPLANT
TROCAR XCEL NON-BLD 5MMX100MML (ENDOMECHANICALS) ×2 IMPLANT
TUBING INSUFFLATION (TUBING) ×2 IMPLANT
WATER STERILE IRR 1000ML POUR (IV SOLUTION) ×2 IMPLANT

## 2018-03-12 NOTE — Anesthesia Preprocedure Evaluation (Signed)
Anesthesia Evaluation  Patient identified by MRN, date of birth, ID band Patient awake    Reviewed: Allergy & Precautions, NPO status , Patient's Chart, lab work & pertinent test results  Airway Mallampati: II       Dental no notable dental hx. (+) Teeth Intact   Pulmonary neg pulmonary ROS,    Pulmonary exam normal breath sounds clear to auscultation       Cardiovascular negative cardio ROS Normal cardiovascular exam Rhythm:Regular Rate:Normal     Neuro/Psych negative neurological ROS  negative psych ROS   GI/Hepatic Neg liver ROS, GERD  Medicated,  Endo/Other  diabetes, Type 2, Oral Hypoglycemic Agents  Renal/GU   negative genitourinary   Musculoskeletal negative musculoskeletal ROS (+)   Abdominal (+) + obese,   Peds  Hematology negative hematology ROS (+)   Anesthesia Other Findings   Reproductive/Obstetrics                             Anesthesia Physical Anesthesia Plan  ASA: II  Anesthesia Plan: General   Post-op Pain Management:    Induction: Intravenous  PONV Risk Score and Plan: Ondansetron, Dexamethasone and Midazolam  Airway Management Planned: Oral ETT  Additional Equipment:   Intra-op Plan:   Post-operative Plan: Extubation in OR  Informed Consent: I have reviewed the patients History and Physical, chart, labs and discussed the procedure including the risks, benefits and alternatives for the proposed anesthesia with the patient or authorized representative who has indicated his/her understanding and acceptance.   Dental advisory given  Plan Discussed with: CRNA and Surgeon  Anesthesia Plan Comments:         Anesthesia Quick Evaluation

## 2018-03-12 NOTE — Interval H&P Note (Signed)
History and Physical Interval Note:  03/12/2018 11:00 AM I have reviewed imaging, labs and examined patient. He continues to have ruq pain and is tender on exam. I discussed lap chole with him.             I discussed the procedure in detail.We discussed the risks and benefits of a laparoscopic cholecystectomy and possible cholangiogram including, but not limited to bleeding, infection, injury to surrounding structures such as the intestine or liver, bile leak, retained gallstones, need to convert to an open procedure, prolonged diarrhea, blood clots such as  DVT, common bile duct injury, anesthesia risks, and possible need for additional procedures.  The likelihood of improvement in symptoms and return to the patient's normal status is good. We discussed the typical post-operative recovery course.   [facility]  has presented today for surgery, with the diagnosis of CHOLECYSTITIS  The various methods of treatment have been discussed with the patient and family. After consideration of risks, benefits and other options for treatment, the patient has consented to  Procedure(s): LAPAROSCOPIC CHOLECYSTECTOMY (N/A) as a surgical intervention .  The patient's history has been reviewed, patient examined, no change in status, stable for surgery.  I have reviewed the patient's chart and labs.  Questions were answered to the patient's satisfaction.     Rolm Bookbinder

## 2018-03-12 NOTE — Consult Note (Signed)
Cardiology Admission History and Physical:   Patient ID: David Pearson MRN: [id number]; DOB: 07-Sep-1950   Admission date: 03/11/2018  Primary Care Provider: Patient, No Pcp Per Primary Cardiologist::  New  Patient Profile:   David Pearson is a 67 y.o. male who we are asked to see for tachycardia    History of Present Illness:   David Pearson  Is a 67 yo who we are asked to see re tachycardia  Hx of DM (on Metformin)   He does not see a doctor regularly   Michela Pitcher he is on Metformin to lose wt   He does not exercise regularly but denies CP   No dizziness  No palpitations.   He was doing OK until Tuesday when he began having abdominal pain, F/C, sweats    Has not eaten anything since Tuesday  He says his urine is concentrated  Today went to outside [place]   REferred to [facility] for eval of abdominal pain Found to have acute cholecystitis   He is now s/p lap choley Anesthesia report shows he had ST 110s throughout  He is now in room   Sitting in chair   HR 90s (SR)        Past Medical History:  Diagnosis Date  . Diabetes mellitus without complication [facility])     Past Surgical History:  Procedure Laterality Date  . HERNIA REPAIR    . TONSILLECTOMY       Medications Prior to Admission: Prior to Admission medications   Medication Sig Start Date End Date Taking? Authorizing Provider  B Complex-C (B-COMPLEX WITH VITAMIN C) tablet Take 1 tablet by mouth daily.   Yes [provider]  cholecalciferol (VITAMIN D) 1000 units tablet Take 1,000 Units by mouth daily.   Yes [provider]  clobetasol cream (TEMOVATE) 3.09 % Apply 1 application topically 2 (two) times daily.   Yes [provider]  folic acid (FOLVITE) 1 MG tablet Take 1 mg by mouth daily.   Yes [provider]  metFORMIN (GLUCOPHAGE) 500 MG tablet Take 1,000 mg by mouth daily.   Yes [provider]  Multiple Vitamin (MULTIVITAMIN WITH MINERALS) TABS tablet Take 1 tablet by mouth daily.   Yes  [provider]  omeprazole (PRILOSEC) 20 MG capsule Take 40 mg by mouth daily.   Yes [provider]  simvastatin (ZOCOR) 40 MG tablet Take 40 mg by mouth daily.   Yes [provider]  vitamin E 100 UNIT capsule Take 100 Units by mouth daily.   Yes [provider]     Allergies:   No Known Allergies  Social History:   Social History   Socioeconomic History  . Marital status: Married    Spouse name: Not on file  . Number of children: Not on file  . Years of education: Not on file  . Highest education level: Not on file  Occupational History  . Not on file  Social Needs  . Financial resource strain: Not on file  . Food insecurity:    Worry: Not on file    Inability: Not on file  . Transportation needs:    Medical: Not on file    Non-medical: Not on file  Tobacco Use  . Smoking status: Never Smoker  . Smokeless tobacco: Never Used  Substance and Sexual Activity  . Alcohol use: Yes  . Drug use: Not Currently  . Sexual activity: Not on file  Lifestyle  . Physical activity:  Days per week: Not on file    Minutes per session: Not on file  . Stress: Not on file  Relationships  . Social connections:    Talks on phone: Not on file    Gets together: Not on file    Attends religious service: Not on file    Active member of club or organization: Not on file    Attends meetings of clubs or organizations: Not on file    Relationship status: Not on file  . Intimate partner violence:    Fear of current or ex partner: Not on file    Emotionally abused: Not on file    Physically abused: Not on file    Forced sexual activity: Not on file  Other Topics Concern  . Not on file  Social History Narrative  . Not on file    Family History:  No FHx of premature CAD   The patient's family history is not on file.    ROS:  All systems reviewed  Neg to above problem except as noted above     Physical Exam/Data:   Vitals:   03/12/18 0411  03/12/18 1305 03/12/18 1319 03/12/18 1418  BP: 121/89 114/68 133/71   Pulse: (!) 114 (!) 113 (!) 107   Resp: (!) 22 (!) 22 18   Temp: 98.7 F (37.1 C) (!) 97 F (36.1 C)  (!) 97.2 F (36.2 C)  TempSrc: Oral     SpO2: 94% 90% 93%   Weight: 110.1 kg     Height: 5\' 10"  (1.778 m)       Intake/Output Summary (Last 24 hours) at 03/12/2018 1610 Last data filed at 03/12/2018 1419 Gross per 24 hour  Intake 3550 ml  Output 350 ml  Net 3200 ml   Filed Weights   03/11/18 2031 03/12/18 0123 03/12/18 0411  Weight: 109.3 kg 108.9 kg 110.1 kg   Body mass index is 34.82 kg/m.  General:  Obese 67 yo in NAD    Examined in chair   HEENT: normal Lymph: no adenopathy Neck: no JVD Endocrine:  No thryomegaly Vascular: No carotid bruits; FA pulses 2+ bilaterally without bruits  Cardiac:  normal S1, S2; RRR; no murmurs  Lungs:  clear to auscultation bilaterally, no wheezing, rhonchi or rales  Abd: Deferred    Ext: no  edema Musculoskeletal:  No deformities, BUE and BLE strength normal and equal Skin: warm and dry  Neuro:  CNs 2-12 intact, no focal abnormalities noted Psych:  Normal affect    EKG:  The ECG that was done today  was personally reviewed and demonstrates ST 103 bpm   Possible IWMI    Relevant CV Studies: None  Laboratory Data:  Chemistry Recent Labs  Lab 03/11/18 2049  NA 135  K 3.4*  CL 99  CO2 23  GLUCOSE 163*  BUN 11  CREATININE 0.78  CALCIUM 9.6  GFRNONAA >60  GFRAA >60  ANIONGAP 13    Recent Labs  Lab 03/11/18 2049  PROT 7.5  ALBUMIN 4.2  AST 23  ALT 25  ALKPHOS 70  BILITOT 1.3*   Hematology Recent Labs  Lab 03/11/18 2049  WBC 19.8*  RBC 5.50  HGB 14.3  HCT 44.3  MCV 80.5  MCH 26.0  MCHC 32.3  RDW 14.8  PLT 306   Cardiac EnzymesNo results for input(s): TROPONINI in the last 168 hours. No results for input(s): TROPIPOC in the last 168 hours.  BNPNo results for input(s): BNP, PROBNP in the  last 168 hours.  DDimer No results for  input(s): DDIMER in the last 168 hours.  Radiology/Studies:  Ct Abdomen Pelvis W Contrast  Result Date: 03/11/2018 CLINICAL DATA:  Right upper quadrant pain starting yesterday. Nausea, fever, loss of appetite, difficulty defecating. EXAM: CT ABDOMEN AND PELVIS WITH CONTRAST TECHNIQUE: Multidetector CT imaging of the abdomen and pelvis was performed using the standard protocol following bolus administration of intravenous contrast. CONTRAST:  169mL OMNIPAQUE IOHEXOL 300 MG/ML  SOLN COMPARISON:  None. FINDINGS: Lower chest: Linear atelectasis in the right lung base. Small esophageal hiatal hernia. Hepatobiliary: Mild diffuse fatty infiltration of the liver. Circumscribed low-attenuation lesion in segment 6 is likely a small cyst. Additional subcentimeter lesions too small to characterize, also likely cysts or hemangiomas. Cholelithiasis with several stones in the gallbladder. Gallbladder wall is thickened and edematous with mild infiltration in the surrounding fat consistent with acute cholecystitis. Bile ducts are not dilated. Pancreas: Unremarkable. No pancreatic ductal dilatation or surrounding inflammatory changes. Spleen: Normal in size without focal abnormality. Adrenals/Urinary Tract: Adrenal glands are unremarkable. Kidneys are normal, without renal calculi, focal lesion, or hydronephrosis. Bladder is unremarkable. Stomach/Bowel: Stomach and small bowel are mostly decompressed. Scattered stool throughout the colon. No colonic distention or wall thickening. Diverticulosis of the sigmoid colon without evidence of diverticulitis. Appendix is normal. Vascular/Lymphatic: Aortic atherosclerosis. No enlarged abdominal or pelvic lymph nodes. Reproductive: Prostate is unremarkable. Other: Postoperative changes consistent with bilateral inguinal hernia repairs and anterior abdominal wall mesh. Small periumbilical hernias containing fat. No free air or free fluid in the abdomen. Musculoskeletal: Degenerative changes  in the spine. No destructive bone lesions. IMPRESSION: Cholelithiasis with inflammatory changes in the gallbladder consistent with acute cholecystitis. Diffuse fatty infiltration of the liver. Probable hepatic cysts. Small esophageal hiatal hernia. Diverticulosis of the colon without evidence of diverticulitis. Small periumbilical hernia containing fat. Aortic Atherosclerosis (ICD10-I70.0). Electronically Signed   By: Lucienne Capers M.D.   On: 03/11/2018 23:05    Assessment and Plan:   1  Tachycardia   Pt with sinus tachycardia earlier today   Most likely due to dehydration, inflammation, pain.   Currently HR is down   He is getting IV fluids  Cardiac exam is not impressive     Would continue IV fluids overnight COntinue telemetry    Follow BP Check UA in AM  2  DM   Check Hgb A1C    Discussed with him the importance of controlling if he has sugar elevation  3 HCM   Will check lipids in AM      For questions or updates, please contact [facility] Please consult [email or web address] for contact info under        Signed, Dorris Carnes, MD  03/12/2018 4:10 PM

## 2018-03-12 NOTE — Progress Notes (Signed)
Patient admitted to 6N03 from ED. Alert and oriented x4. Vital signs normal.Skin intact. Denies pain at this time.oriented to room and call bell.

## 2018-03-12 NOTE — Plan of Care (Signed)
  Problem: Clinical Measurements: Goal: Diagnostic test results will improve Outcome: Progressing   Problem: Activity: Goal: Risk for activity intolerance will decrease Outcome: Progressing   Problem: Nutrition: Goal: Adequate nutrition will be maintained Outcome: Progressing   Problem: Pain Managment: Goal: General experience of comfort will improve Outcome: Progressing   

## 2018-03-12 NOTE — Transfer of Care (Signed)
Immediate Anesthesia Transfer of Care Note  Patient: David Pearson  Procedure(s) Performed: LAPAROSCOPIC CHOLECYSTECTOMY (N/A Abdomen)  Patient Location: PACU  Anesthesia Type:General  Level of Consciousness: awake, alert , oriented, drowsy and patient cooperative  Airway & Oxygen Therapy: Patient Spontanous Breathing and Patient connected to nasal cannula oxygen  Post-op Assessment: Report given to RN and Post -op Vital signs reviewed and stable  Post vital signs: Reviewed and stable  Last Vitals:  Vitals Value Taken Time  BP    Temp    Pulse 112 03/12/2018  1:04 PM  Resp 19 03/12/2018  1:04 PM  SpO2 91 % 03/12/2018  1:04 PM  Vitals shown include unvalidated device data.  Last Pain:  Vitals:   03/12/18 0840  TempSrc:   PainSc: 4       Patients Stated Pain Goal: 2 (88/11/03 1594)  Complications: No apparent anesthesia complications

## 2018-03-12 NOTE — Op Note (Signed)
Preoperative diagnosis acute cholecystitis Postoperative diagnosis: Gangrenous cholecystitis Procedure: Laparoscopic subtotal cholecystectomy Surgeon: Dr. Serita Grammes Assistant: Will Creig Hines Anesthesia: General Estimated blood loss: 338 cc Complications: None Drains: 19 [place] Blake drain to right upper quadrant Specimens gallbladder and stones to pathology Sponge needle count was correct at completion Decision to recovery in stable condition  Indications: This is 68 year old male with right upper quadrant pain and a CT scan consistent with cholecystitis related to gallstones.  His white blood cell count was elevated.  He was very tender on his exam.  I discussed going to the operating room for cholecystectomy.  Procedure: After informed consent was obtained the patient was taken to the operating room.  He was given Zosyn.  SCDs were in place.  He was then placed under general anesthesia without complication.  He was prepped and draped in the standard sterile surgical fashion.  Surgical timeout was then performed.  He had an umbilical hernia after his prior abdominoplasty.  I infiltrated Marcaine below this and made a vertical incision.  I then incised the fascia and I connected this to the hernia.  I then placed a 0 Vicryl pursestring suture through the fascia.  I inserted a Hassan trocar and insufflated the abdomen to 15 mmHg pressure.  It was limited in terms of working space due to his body habitus.  He had a very large omentum.  He had some bilious fluid in his right upper quadrant.  His omentum was overlying his gallbladder.  I then put a 5 mm epigastric trocar and under direct vision as well as to further right-sided trochars.  I was able to peel the omentum off of the gallbladder.  At this point he was noted to have gangrenous cholecystitis.  I was unable to grab the gallbladder so I aspirated it.  There was purulence present in the gallbladder.  I was then able to retract it cephalad.   I was not able to see down in the triangle though.  I placed an additional 5 mm port on the left side to use this to retract his omentum as well as a portion of his stomach.  Eventually I was able to begin to dissect the triangle of Coelho.  I did identify the artery and divided this.  I was not able to get around his gallbladder completely for some time as I realized his gallbladder appeared to be fused to his common bile duct and there was really no way that I was going to be able to separate this or safely dissected.  I elected at that point to do a dome down approach and I took the gallbladder off of the liver bed with cautery.  I did make a hole in the gallbladder and extracted many stones as I could.  Once I got to the point where I thought this was safe I enlarged a hole in the proximal portion of the gallbladder and I ensured that I removed all of the contents of the gallbladder.  I then sized the epigastric trocar up to a 12 mm trocar.  I then used a GIA stapler to leave a small cuff of gallbladder on what I believed to be the common bile duct.  The gallbladder and all of the stones were then placed in a retrieval bag and removed from the epigastric trocar site.  I then irrigated copiously.  I evacuated all the stones I could identify.  I then placed 2 pieces of Surgicel snow in the gallbladder bed.  I placed a 75 [place] Blake drain through the right lower quadrant incision.  I then removed my epigastric trocar and closed the site with 0 Vicryl sutures using the suture passer.  The umbilical trocar was then removed.  I tied my pursestring down but I did add a couple more 0 Vicryl sutures that obliterated the hernia as well as the incision.  The abdomen was desufflated and the remaining trochars were removed.  These were closed with 4-0 Monocryl and glue.  He tolerated this well.  He did have some tachycardia during the operation and we are going to plan on monitoring the stepdown unit have cardiology see  him as well.

## 2018-03-12 NOTE — Discharge Instructions (Addendum)

## 2018-03-12 NOTE — ED Notes (Signed)
Pt O2 stats 89 and 90. Pt placed on 1lpm O2 by N/C

## 2018-03-12 NOTE — Progress Notes (Signed)
Back from PACU, alert, oriented, IVF infusing well

## 2018-03-12 NOTE — Plan of Care (Signed)
  Problem: Pain Managment: Goal: General experience of comfort will improve Outcome: Progressing   Problem: Clinical Measurements: Goal: Postoperative complications will be avoided or minimized Outcome: Progressing

## 2018-03-12 NOTE — H&P (Signed)
David Pearson is an 67 y.o. male.   Chief Complaint: Abdominal pain HPI: Patient presents to the emergency room with a 1 day history of right upper quadrant abdominal pain.  The pain started yesterday.  Location is right upper quadrant and is described as constant and severe.  It is a 7-9 out of 10.  There is no radiation.  He has had similar attacks of pain in the past that involve his upper abdomen and lower chest he states.  He states he had a peanut yesterday and this is what started.  CT scan shows a thickened gallbladder wall and gallstones consistent with acute cholecystitis.  He has had a low-grade fever at his primary care physician's office today and he was sent from there to the emergency room for further evaluation.  Past Medical History:  Diagnosis Date  . Diabetes mellitus without complication [facility])     Past Surgical History:  Procedure Laterality Date  . HERNIA REPAIR    . TONSILLECTOMY      No family history on file. Social History:  reports that he has never smoked. He has never used smokeless tobacco. He reports that he drinks alcohol. He reports that he has current or past drug history.  Allergies: No Known Allergies   (Not in a hospital admission)  Results for orders placed or performed during the hospital encounter of 03/11/18 (from the past 48 hour(s))  Urinalysis, Routine w reflex microscopic     Status: None   Collection Time: 03/11/18  8:48 PM  Result Value Ref Range   Color, Urine YELLOW YELLOW   APPearance CLEAR CLEAR   Specific Gravity, Urine 1.018 1.005 - 1.030   pH 6.0 5.0 - 8.0   Glucose, UA NEGATIVE NEGATIVE mg/dL   Hgb urine dipstick NEGATIVE NEGATIVE   Bilirubin Urine NEGATIVE NEGATIVE   Ketones, ur NEGATIVE NEGATIVE mg/dL   Protein, ur NEGATIVE NEGATIVE mg/dL   Nitrite NEGATIVE NEGATIVE   Leukocytes, UA NEGATIVE NEGATIVE    Comment: Performed at [facility] [address], [place], [place] [address]  Comprehensive metabolic panel      Status: Abnormal   Collection Time: 03/11/18  8:49 PM  Result Value Ref Range   Sodium 135 135 - 145 mmol/L   Potassium 3.4 (L) 3.5 - 5.1 mmol/L   Chloride 99 98 - 111 mmol/L   CO2 23 22 - 32 mmol/L   Glucose, Bld 163 (H) 70 - 99 mg/dL   BUN 11 8 - 23 mg/dL   Creatinine, Ser 0.78 0.61 - 1.24 mg/dL   Calcium 9.6 8.9 - 10.3 mg/dL   Total Protein 7.5 6.5 - 8.1 g/dL   Albumin 4.2 3.5 - 5.0 g/dL   AST 23 15 - 41 U/L   ALT 25 0 - 44 U/L   Alkaline Phosphatase 70 38 - 126 U/L   Total Bilirubin 1.3 (H) 0.3 - 1.2 mg/dL   GFR calc non Af Amer >60 >60 mL/min   GFR calc Af Amer >60 >60 mL/min    Comment: (NOTE) The eGFR has been calculated using the CKD EPI equation. This calculation has not been validated in all clinical situations. eGFR's persistently <60 mL/min signify possible Chronic Kidney Disease.    Anion gap 13 5 - 15    Comment: Performed at [facility] [address] [place], [place] [address]  CBC with Differential     Status: Abnormal   Collection Time: 03/11/18  8:49 PM  Result Value  Ref Range   WBC 19.8 (H) 4.0 - 10.5 K/uL   RBC 5.50 4.22 - 5.81 MIL/uL   Hemoglobin 14.3 13.0 - 17.0 g/dL   HCT 44.3 39.0 - 52.0 %   MCV 80.5 80.0 - 100.0 fL   MCH 26.0 26.0 - 34.0 pg   MCHC 32.3 30.0 - 36.0 g/dL   RDW 14.8 11.5 - 15.5 %   Platelets 306 150 - 400 K/uL   nRBC 0.0 0.0 - 0.2 %   Neutrophils Relative % 86 %   Neutro Abs 17.1 (H) 1.7 - 7.7 K/uL   Lymphocytes Relative 6 %   Lymphs Abs 1.2 0.7 - 4.0 K/uL   Monocytes Relative 7 %   Monocytes Absolute 1.4 (H) 0.1 - 1.0 K/uL   Eosinophils Relative 0 %   Eosinophils Absolute 0.0 0.0 - 0.5 K/uL   Basophils Relative 0 %   Basophils Absolute 0.0 0.0 - 0.1 K/uL   Immature Granulocytes 1 %   Abs Immature Granulocytes 0.12 (H) 0.00 - 0.07 K/uL    Comment: Performed at [facility], [address], [place], [place] [address]  Protime-INR     Status: Abnormal   Collection Time: 03/11/18  8:49 PM  Result Value Ref  Range   Prothrombin Time 15.3 (H) 11.4 - 15.2 seconds   INR 1.22     Comment: Performed at [facility] [address] [place], [place] [address]  Lipase, blood     Status: None   Collection Time: 03/11/18  8:49 PM  Result Value Ref Range   Lipase 28 11 - 51 U/L    Comment: Performed at [facility], [place] [address] [place], [place] [address]  I-Stat CG4 Lactic Acid, ED     Status: None   Collection Time: 03/11/18  9:06 PM  Result Value Ref Range   Lactic Acid, Venous 1.62 0.5 - 1.9 mmol/L   Ct Abdomen Pelvis W Contrast  Result Date: 03/11/2018 CLINICAL DATA:  Right upper quadrant pain starting yesterday. Nausea, fever, loss of appetite, difficulty defecating. EXAM: CT ABDOMEN AND PELVIS WITH CONTRAST TECHNIQUE: Multidetector CT imaging of the abdomen and pelvis was performed using the standard protocol following bolus administration of intravenous contrast. CONTRAST:  160m OMNIPAQUE IOHEXOL 300 MG/ML  SOLN COMPARISON:  None. FINDINGS: Lower chest: Linear atelectasis in the right lung base. Small esophageal hiatal hernia. Hepatobiliary: Mild diffuse fatty infiltration of the liver. Circumscribed low-attenuation lesion in segment 6 is likely a small cyst. Additional subcentimeter lesions too small to characterize, also likely cysts or hemangiomas. Cholelithiasis with several stones in the gallbladder. Gallbladder wall is thickened and edematous with mild infiltration in the surrounding fat consistent with acute cholecystitis. Bile ducts are not dilated. Pancreas: Unremarkable. No pancreatic ductal dilatation or surrounding inflammatory changes. Spleen: Normal in size without focal abnormality. Adrenals/Urinary Tract: Adrenal glands are unremarkable. Kidneys are normal, without renal calculi, focal lesion, or hydronephrosis. Bladder is unremarkable. Stomach/Bowel: Stomach and small bowel are mostly decompressed. Scattered stool throughout the colon. No colonic distention or wall  thickening. Diverticulosis of the sigmoid colon without evidence of diverticulitis. Appendix is normal. Vascular/Lymphatic: Aortic atherosclerosis. No enlarged abdominal or pelvic lymph nodes. Reproductive: Prostate is unremarkable. Other: Postoperative changes consistent with bilateral inguinal hernia repairs and anterior abdominal wall mesh. Small periumbilical hernias containing fat. No free air or free fluid in the abdomen. Musculoskeletal: Degenerative changes in the spine. No destructive bone lesions. IMPRESSION: Cholelithiasis with inflammatory changes in the gallbladder  consistent with acute cholecystitis. Diffuse fatty infiltration of the liver. Probable hepatic cysts. Small esophageal hiatal hernia. Diverticulosis of the colon without evidence of diverticulitis. Small periumbilical hernia containing fat. Aortic Atherosclerosis (ICD10-I70.0). Electronically Signed   By: Lucienne Capers M.D.   On: 03/11/2018 23:05    Review of Systems  Constitutional: Positive for fever. Negative for weight loss.  Gastrointestinal: Positive for abdominal pain.  All other systems reviewed and are negative.   Blood pressure 123/90, pulse (!) 114, temperature 99 F (37.2 C), temperature source Oral, resp. rate 18, height 5' 10"  (1.778 m), weight 109.3 kg, SpO2 94 %. Physical Exam  HENT:  Head: Normocephalic and atraumatic.  Eyes: Pupils are equal, round, and reactive to light. No scleral icterus.  Neck: Normal range of motion.  Cardiovascular: Normal rate and regular rhythm.  Respiratory: Effort normal and breath sounds normal.  GI: Soft. Normal appearance. There is tenderness. There is positive Murphy's sign.    Musculoskeletal: Normal range of motion.  Neurological: He is alert.  Skin: Skin is warm and dry.     Assessment/Plan Acute cholecystitis-admit for IV fluids IV antibiotics n.p.o. and cholecystectomy later today if schedule permits by Dr. Donne Hazel.  The procedure discussed with the patient  as well as the pros and cons of surgery and the possibility of open surgery.  Umbilical hernia-small and reducible  Type 2 diabetes mellitus-Place on sliding scale insulin  Turner Daniels, MD 03/12/2018, 12:32 AM

## 2018-03-13 ENCOUNTER — Encounter (HOSPITAL_COMMUNITY): Payer: Self-pay | Admitting: General Surgery

## 2018-03-13 LAB — TROPONIN I

## 2018-03-13 LAB — CBC
HEMATOCRIT: 32.9 % — AB (ref 39.0–52.0)
Hemoglobin: 10.5 g/dL — ABNORMAL LOW (ref 13.0–17.0)
MCH: 26.9 pg (ref 26.0–34.0)
MCHC: 31.9 g/dL (ref 30.0–36.0)
MCV: 84.1 fL (ref 80.0–100.0)
NRBC: 0 % (ref 0.0–0.2)
PLATELETS: 184 10*3/uL (ref 150–400)
RBC: 3.91 MIL/uL — ABNORMAL LOW (ref 4.22–5.81)
RDW: 15.6 % — AB (ref 11.5–15.5)
WBC: 10.2 10*3/uL (ref 4.0–10.5)

## 2018-03-13 LAB — COMPREHENSIVE METABOLIC PANEL
ALBUMIN: 2.9 g/dL — AB (ref 3.5–5.0)
ALT: 38 U/L (ref 0–44)
ANION GAP: 7 (ref 5–15)
AST: 33 U/L (ref 15–41)
Alkaline Phosphatase: 59 U/L (ref 38–126)
BILIRUBIN TOTAL: 0.7 mg/dL (ref 0.3–1.2)
BUN: 11 mg/dL (ref 8–23)
CHLORIDE: 103 mmol/L (ref 98–111)
CO2: 27 mmol/L (ref 22–32)
Calcium: 8.1 mg/dL — ABNORMAL LOW (ref 8.9–10.3)
Creatinine, Ser: 0.94 mg/dL (ref 0.61–1.24)
GFR calc Af Amer: >60 mL/min (ref >60)
GFR calc non Af Amer: >60 mL/min (ref >60)
GLUCOSE: 169 mg/dL — AB (ref 70–99)
POTASSIUM: 4 mmol/L (ref 3.5–5.1)
SODIUM: 137 mmol/L (ref 135–145)
Total Protein: 5.5 g/dL — ABNORMAL LOW (ref 6.5–8.1)

## 2018-03-13 LAB — GLUCOSE, CAPILLARY
GLUCOSE-CAPILLARY: 142 mg/dL — AB (ref 70–99)
Glucose-Capillary: 162 mg/dL — ABNORMAL HIGH (ref 70–99)
Glucose-Capillary: 163 mg/dL — ABNORMAL HIGH (ref 70–99)

## 2018-03-13 MED ORDER — AMOXICILLIN-POT CLAVULANATE 875-125 MG PO TABS: 1.0000 | ORAL_TABLET | Freq: Two times a day (BID) | ORAL | 0 refills | Status: AC

## 2018-03-13 MED ORDER — AMOXICILLIN-POT CLAVULANATE 875-125 MG PO TABS: 1.0000 | ORAL_TABLET | Freq: Two times a day (BID) | ORAL | Status: DC

## 2018-03-13 MED ORDER — INSULIN ASPART 100 UNIT/ML ~~LOC~~ SOLN: 0.0000 [IU] | Freq: Three times a day (TID) | SUBCUTANEOUS | Status: DC

## 2018-03-13 MED ORDER — OXYCODONE HCL 5 MG PO TABS: 5.0000 mg | ORAL_TABLET | Freq: Four times a day (QID) | ORAL | 0 refills | Status: DC | PRN

## 2018-03-13 NOTE — Discharge Summary (Signed)
[facility]/Trauma Discharge Summary   Patient ID: David Pearson MRN: [id number] DOB/AGE: 66-23-1952 66 y.o.  Admit date: 03/11/2018 Discharge date: 03/13/2018  Admitting Diagnosis: cholecystitis  Discharge Diagnosis Patient Active Problem List   Diagnosis Date Noted  . Acute cholecystitis 03/12/2018    Consultants Cardiology   Imaging: Ct Abdomen Pelvis W Contrast  Result Date: 03/11/2018 CLINICAL DATA:  Right upper quadrant pain starting yesterday. Nausea, fever, loss of appetite, difficulty defecating. EXAM: CT ABDOMEN AND PELVIS WITH CONTRAST TECHNIQUE: Multidetector CT imaging of the abdomen and pelvis was performed using the standard protocol following bolus administration of intravenous contrast. CONTRAST:  169mL OMNIPAQUE IOHEXOL 300 MG/ML  SOLN COMPARISON:  None. FINDINGS: Lower chest: Linear atelectasis in the right lung base. Small esophageal hiatal hernia. Hepatobiliary: Mild diffuse fatty infiltration of the liver. Circumscribed low-attenuation lesion in segment 6 is likely a small cyst. Additional subcentimeter lesions too small to characterize, also likely cysts or hemangiomas. Cholelithiasis with several stones in the gallbladder. Gallbladder wall is thickened and edematous with mild infiltration in the surrounding fat consistent with acute cholecystitis. Bile ducts are not dilated. Pancreas: Unremarkable. No pancreatic ductal dilatation or surrounding inflammatory changes. Spleen: Normal in size without focal abnormality. Adrenals/Urinary Tract: Adrenal glands are unremarkable. Kidneys are normal, without renal calculi, focal lesion, or hydronephrosis. Bladder is unremarkable. Stomach/Bowel: Stomach and small bowel are mostly decompressed. Scattered stool throughout the colon. No colonic distention or wall thickening. Diverticulosis of the sigmoid colon without evidence of diverticulitis. Appendix is normal. Vascular/Lymphatic: Aortic atherosclerosis. No enlarged  abdominal or pelvic lymph nodes. Reproductive: Prostate is unremarkable. Other: Postoperative changes consistent with bilateral inguinal hernia repairs and anterior abdominal wall mesh. Small periumbilical hernias containing fat. No free air or free fluid in the abdomen. Musculoskeletal: Degenerative changes in the spine. No destructive bone lesions. IMPRESSION: Cholelithiasis with inflammatory changes in the gallbladder consistent with acute cholecystitis. Diffuse fatty infiltration of the liver. Probable hepatic cysts. Small esophageal hiatal hernia. Diverticulosis of the colon without evidence of diverticulitis. Small periumbilical hernia containing fat. Aortic Atherosclerosis (ICD10-I70.0). Electronically Signed   By: Lucienne Capers M.D.   On: 03/11/2018 23:05    Procedures Dr. Donne Hazel (03/12/18) - Laparoscopic subtotal Cholecystectomy   Hospital Course:  Pt is a 68 yo male who presented to [facility] with abdominal pain.  Workup showed cholecystitis.  Patient was admitted and underwent procedure listed above.  Tolerated procedure well and was transferred to the floor. Pt had tachycardia during the procedure so cardiology was consulted. They did not feel further workup was warranted. Diet was advanced as tolerated.  On POD#1, the patient was voiding well, tolerating diet, ambulating well, pain well controlled, vital signs stable, incisions c/d/i and felt stable for discharge home.  Patient will follow up as outlined below and knows to call with questions or concerns.     Patient was discharged in good condition.  The [place] Substance controlled database was reviewed prior to prescribing narcotic pain medication to this patient.  Physical Exam: General:  Alert, NAD, pleasant, cooperative Cardio: RRR, S1 & S2 normal, no murmur, rubs, gallops Resp: Effort normal, lungs CTA bilaterally, no wheezes, rales, rhonchi Abd:  Soft, ND, normal bowel sounds, very mild generalized tenderness without  guarding, drain with minimal sanguinous drainage  Skin: warm and dry, no rashes noted  Allergies as of 03/13/2018   No Known Allergies     Medication List    TAKE these medications   amoxicillin-clavulanate 875-125 MG tablet Commonly known as:  AUGMENTIN Take 1 tablet by mouth every 12 (twelve) hours for 5 days.   B-complex with vitamin C tablet Take 1 tablet by mouth daily.   cholecalciferol 1000 units tablet Commonly known as:  VITAMIN D Take 1,000 Units by mouth daily.   clobetasol cream 0.05 % Commonly known as:  TEMOVATE Apply 1 application topically 2 (two) times daily.   folic acid 1 MG tablet Commonly known as:  FOLVITE Take 1 mg by mouth daily.   metFORMIN 500 MG tablet Commonly known as:  GLUCOPHAGE Take 1,000 mg by mouth daily.   multivitamin with minerals Tabs tablet Take 1 tablet by mouth daily.   omeprazole 20 MG capsule Commonly known as:  PRILOSEC Take 40 mg by mouth daily.   oxyCODONE 5 MG immediate release tablet Commonly known as:  Oxy IR/ROXICODONE Take 1 tablet (5 mg total) by mouth every 6 (six) hours as needed for moderate pain.   simvastatin 40 MG tablet Commonly known as:  ZOCOR Take 40 mg by mouth daily.   vitamin E 100 UNIT capsule Take 100 Units by mouth daily.        Follow-up [facility], [place]. Go on 03/19/2018.   Specialty:  General Surgery Why:  1:30pm for follow up. Please arrive 30 minutes prior to complete paperwork. Please bring photo ID and insurance card Contact information: [address] [place] [phone]          Signed: [facility] 03/13/2018, 9:00 AM Pager: [phone] Consults: [phone] Mon-Fri 7:00 am-4:30 pm Sat-Sun 7:00 am-11:30 am

## 2018-03-13 NOTE — Progress Notes (Signed)
Patient refused insulin with every blood sugar check.

## 2018-03-13 NOTE — Progress Notes (Signed)
Pt for discharge today going home, his wife at the bedside, explained the next appointment, prescriptions, due meds explained and understood, no complain of pain at this time, given all his personal belongings.

## 2018-03-13 NOTE — Progress Notes (Addendum)
Progress Note  Patient Name: David Pearson Date of Encounter: 03/13/2018  Primary Cardiologist: None  Subjective   Breathing is Ok  No CP   Inpatient Medications    Scheduled Meds: . acetaminophen  1,000 mg Oral Q8H  . enoxaparin (LOVENOX) injection  40 mg Subcutaneous Q24H  . insulin aspart  0-15 Units Subcutaneous Q4H  . pantoprazole  40 mg Oral QHS   Continuous Infusions: . dextrose 5 % and 0.9% NaCl 100 mL/hr at 03/13/18 0500  . lactated ringers 10 mL/hr at 03/12/18 1040  . piperacillin-tazobactam (ZOSYN)  IV 3.375 g (03/13/18 0452)   PRN Meds: diphenhydrAMINE, hydrALAZINE, HYDROmorphone (DILAUDID) injection, ondansetron **OR** ondansetron (ZOFRAN) IV, oxyCODONE   Vital Signs    Vitals:   03/12/18 1841 03/12/18 2109 03/13/18 0222 03/13/18 0441  BP: 111/77 127/79 133/75 131/85  Pulse: 78 79 63 70  Resp: 18 19 17 18   Temp: 97.8 F (36.6 C) 98 F (36.7 C) (!) 97.3 F (36.3 C) 97.8 F (36.6 C)  TempSrc: Oral Oral Oral Oral  SpO2: 97% 94% 97% 96%  Weight:      Height:        Intake/Output Summary (Last 24 hours) at 03/13/2018 0709 Last data filed at 03/13/2018 0500 Gross per 24 hour  Intake 4788.13 ml  Output 1620 ml  Net 3168.13 ml   Filed Weights   03/11/18 2031 03/12/18 0123 03/12/18 0411  Weight: 109.3 kg 108.9 kg 110.1 kg    Telemetry    Off of tele  Personally Reviewed  ECG      Physical Exam   GEN: No acute distress.   Neck: No JVD Cardiac: RRR, no murmurs, rubs, or gallops.  Respiratory: Clear to auscultation bilaterally. GI: Soft, nontender, non-distended  MS: No edema; No deformity. Neuro:  Nonfocal  Psych: Normal affect   Labs    Chemistry Recent Labs  Lab 03/11/18 2049 03/13/18 0159  NA 135 137  K 3.4* 4.0  CL 99 103  CO2 23 27  GLUCOSE 163* 169*  BUN 11 11  CREATININE 0.78 0.94  CALCIUM 9.6 8.1*  PROT 7.5 5.5*  ALBUMIN 4.2 2.9*  AST 23 33  ALT 25 38  ALKPHOS 70 59  BILITOT 1.3* 0.7  GFRNONAA >60 >60  GFRAA  >60 >60  ANIONGAP 13 7     Hematology Recent Labs  Lab 03/11/18 2049 03/13/18 0159  WBC 19.8* 10.2  RBC 5.50 3.91*  HGB 14.3 10.5*  HCT 44.3 32.9*  MCV 80.5 84.1  MCH 26.0 26.9  MCHC 32.3 31.9  RDW 14.8 15.6*  PLT 306 184    Cardiac Enzymes Recent Labs  Lab 03/12/18 1459 03/12/18 1953 03/13/18 0159  TROPONINI <0.03 <0.03 <0.03   No results for input(s): TROPIPOC in the last 168 hours.   BNPNo results for input(s): BNP, PROBNP in the last 168 hours.   DDimer No results for input(s): DDIMER in the last 168 hours.   Radiology    Ct Abdomen Pelvis W Contrast  Result Date: 03/11/2018 CLINICAL DATA:  Right upper quadrant pain starting yesterday. Nausea, fever, loss of appetite, difficulty defecating. EXAM: CT ABDOMEN AND PELVIS WITH CONTRAST TECHNIQUE: Multidetector CT imaging of the abdomen and pelvis was performed using the standard protocol following bolus administration of intravenous contrast. CONTRAST:  141mL OMNIPAQUE IOHEXOL 300 MG/ML  SOLN COMPARISON:  None. FINDINGS: Lower chest: Linear atelectasis in the right lung base. Small esophageal hiatal hernia. Hepatobiliary: Mild diffuse fatty infiltration of the liver. Circumscribed low-attenuation  lesion in segment 6 is likely a small cyst. Additional subcentimeter lesions too small to characterize, also likely cysts or hemangiomas. Cholelithiasis with several stones in the gallbladder. Gallbladder wall is thickened and edematous with mild infiltration in the surrounding fat consistent with acute cholecystitis. Bile ducts are not dilated. Pancreas: Unremarkable. No pancreatic ductal dilatation or surrounding inflammatory changes. Spleen: Normal in size without focal abnormality. Adrenals/Urinary Tract: Adrenal glands are unremarkable. Kidneys are normal, without renal calculi, focal lesion, or hydronephrosis. Bladder is unremarkable. Stomach/Bowel: Stomach and small bowel are mostly decompressed. Scattered stool throughout the  colon. No colonic distention or wall thickening. Diverticulosis of the sigmoid colon without evidence of diverticulitis. Appendix is normal. Vascular/Lymphatic: Aortic atherosclerosis. No enlarged abdominal or pelvic lymph nodes. Reproductive: Prostate is unremarkable. Other: Postoperative changes consistent with bilateral inguinal hernia repairs and anterior abdominal wall mesh. Small periumbilical hernias containing fat. No free air or free fluid in the abdomen. Musculoskeletal: Degenerative changes in the spine. No destructive bone lesions. IMPRESSION: Cholelithiasis with inflammatory changes in the gallbladder consistent with acute cholecystitis. Diffuse fatty infiltration of the liver. Probable hepatic cysts. Small esophageal hiatal hernia. Diverticulosis of the colon without evidence of diverticulitis. Small periumbilical hernia containing fat. Aortic Atherosclerosis (ICD10-I70.0). Electronically Signed   By: Lucienne Capers M.D.   On: 03/11/2018 23:05    Cardiac Studies    Patient Profile     67 y.o. male with no prior cardiac history   Presented with abdominal pain, acute cholecystitis and sinus tachycardia    Assessment & Plan    1  Sinus tachycardia   Resolved with improvement of other medical issues    2  Aortic atherosclerosis   Seen on CT   Risk factors will need to be managed as outpt    LDL was 86 yesterday   May be lower than usual with recent stress   Follow in several months with primary MD     Diet and exercise   Watch glucose over time  A1C 6.3  Will sign off.  For questions or updates, please contact [place] Please consult [email or web address] for contact info under        Signed, Dorris Carnes, MD  03/13/2018, 7:09 AM

## 2018-03-16 NOTE — Anesthesia Postprocedure Evaluation (Signed)
Anesthesia Post Note  Patient: David Pearson  Procedure(s) Performed: LAPAROSCOPIC CHOLECYSTECTOMY (N/A Abdomen)     Patient location during evaluation: PACU Anesthesia Type: General Level of consciousness: awake Pain management: pain level controlled Vital Signs Assessment: post-procedure vital signs reviewed and stable Respiratory status: spontaneous breathing Cardiovascular status: stable Postop Assessment: no apparent nausea or vomiting Anesthetic complications: no    Last Vitals:  Vitals:   03/13/18 0441 03/13/18 0956  BP: 131/85 114/79  Pulse: 70 72  Resp: 18 18  Temp: 36.6 C (!) 36.4 C  SpO2: 96% 95%    Last Pain:  Vitals:   03/13/18 0956  TempSrc: Oral  PainSc:    Pain Goal: Patients Stated Pain Goal: 2 (03/12/18 0840)               Emmamarie Kluender JR,Audley Mateo Flow

## 2018-03-17 ENCOUNTER — Encounter (HOSPITAL_COMMUNITY): Payer: Self-pay | Admitting: General Surgery

## 2018-03-17 LAB — CULTURE, BLOOD (ROUTINE X 2)
Culture: NO GROWTH
Culture: NO GROWTH
SPECIAL REQUESTS: ADEQUATE

## 2018-05-08 ENCOUNTER — Other Ambulatory Visit: Payer: Self-pay

## 2018-05-08 ENCOUNTER — Encounter: Payer: Self-pay | Admitting: Osteopathic Medicine

## 2018-05-08 ENCOUNTER — Ambulatory Visit (INDEPENDENT_AMBULATORY_CARE_PROVIDER_SITE_OTHER): Payer: Managed Care, Other (non HMO) | Admitting: Osteopathic Medicine

## 2018-05-08 VITALS — BP 150/93 | HR 75 | Temp 97.7°F | Ht 69.5 in | Wt 238.0 lb

## 2018-05-08 DIAGNOSIS — Z0000 Encounter for general adult medical examination without abnormal findings: Secondary | ICD-10-CM

## 2018-05-08 DIAGNOSIS — Z1211 Encounter for screening for malignant neoplasm of colon: Secondary | ICD-10-CM

## 2018-05-08 DIAGNOSIS — Z125 Encounter for screening for malignant neoplasm of prostate: Secondary | ICD-10-CM

## 2018-05-08 DIAGNOSIS — E1169 Type 2 diabetes mellitus with other specified complication: Secondary | ICD-10-CM

## 2018-05-08 DIAGNOSIS — E119 Type 2 diabetes mellitus without complications: Secondary | ICD-10-CM

## 2018-05-08 DIAGNOSIS — Z87891 Personal history of nicotine dependence: Secondary | ICD-10-CM

## 2018-05-08 DIAGNOSIS — E785 Hyperlipidemia, unspecified: Secondary | ICD-10-CM

## 2018-05-08 MED ORDER — TETANUS-DIPHTH-ACELL PERTUSSIS 5-2-15.5 LF-MCG/0.5 IM SUSP: 0.5000 mL | Freq: Once | INTRAMUSCULAR | 0 refills | Status: AC

## 2018-05-08 MED ORDER — PNEUMOCOCCAL VAC POLYVALENT 25 MCG/0.5ML IJ INJ: 0.5000 mL | INJECTION | Freq: Once | INTRAMUSCULAR | 0 refills | Status: AC

## 2018-05-08 MED ORDER — ZOSTER VAC RECOMB ADJUVANTED 50 MCG/0.5ML IM SUSR: 0.5000 mL | Freq: Once | INTRAMUSCULAR | 1 refills | Status: AC

## 2018-05-08 NOTE — Progress Notes (Signed)
HPI: David Pearson is a 67 y.o. male who  has a past medical history of Diabetes mellitus without complication ([place]).  he presents to [facility] today, 05/08/18,  for chief complaint of: New to establish care - requests physical   New patient here to establish care.  Works as a Optometrist.  GERD: Stable on omeprazole 40 mg daily  History of type 2 diabetes, reportedly controlled on metformin 1000 mg twice per day and he is on simvastatin 40 mg daily  Supplements: Takes vitamin D, vitamin D, vitamin A  Former smoker, 2 packs a day for about 10 years.  Quit about 5 years ago  Heterosexual male, monogamous with declines STI testing at this time.  Last colonoscopy reported 15 years ago in [place] reviewed: 03/12/2018 A1c was 6.3% 03/11/2018 CMP showed slightly decreased potassium, slightly increased bilirubin, CBC showed elevated white blood cells but this was a hospitalization for cholecystitis so this is not worrisome     There is no immunization history on file for this patient.    Past medical, surgical, social and family history reviewed:  Patient Active Problem List   Diagnosis Date Noted  . Acute cholecystitis 03/12/2018    Past Surgical History:  Procedure Laterality Date  . CHOLECYSTECTOMY N/A 03/12/2018   Procedure: LAPAROSCOPIC CHOLECYSTECTOMY;  Surgeon: Rolm Bookbinder, MD;  Location: [facility];  Service: General;  Laterality: N/A;  . HERNIA REPAIR    . TONSILLECTOMY      Social History   Tobacco Use  . Smoking status: Never Smoker  . Smokeless tobacco: Never Used  Substance Use Topics  . Alcohol use: Yes    No family history on file.   Current medication list and allergy/intolerance information reviewed:    Current Outpatient Medications  Medication Sig Dispense Refill  . B Complex-C (B-COMPLEX WITH VITAMIN C) tablet Take 1 tablet by mouth daily.    . cholecalciferol (VITAMIN D) 1000 units tablet Take  1,000 Units by mouth daily.    . clobetasol cream (TEMOVATE) 4.26 % Apply 1 application topically 2 (two) times daily.    . folic acid (FOLVITE) 1 MG tablet Take 1 mg by mouth daily.    . metFORMIN (GLUCOPHAGE) 500 MG tablet Take 1,000 mg by mouth daily.    . Multiple Vitamin (MULTIVITAMIN WITH MINERALS) TABS tablet Take 1 tablet by mouth daily.    Marland Kitchen omeprazole (PRILOSEC) 20 MG capsule Take 40 mg by mouth daily.    Marland Kitchen oxyCODONE (OXY IR/ROXICODONE) 5 MG immediate release tablet Take 1 tablet (5 mg total) by mouth every 6 (six) hours as needed for moderate pain. 20 tablet 0  . simvastatin (ZOCOR) 40 MG tablet Take 40 mg by mouth daily.    . vitamin E 100 UNIT capsule Take 100 Units by mouth daily.     No current facility-administered medications for this visit.     No Known Allergies    Review of Systems:  Constitutional:  No  fever, no chills, No recent illness, No unintentional weight changes. No significant fatigue.   HEENT: No  headache, no vision change, no hearing change, No sore throat, No  sinus pressure  Cardiac: No  chest pain, No  pressure, No palpitations, No  Orthopnea  Respiratory:  No  shortness of breath. No  Cough  Gastrointestinal: No  abdominal pain, No  nausea, No  vomiting,  No  blood in stool, No  diarrhea, No  constipation   Musculoskeletal: No new  myalgia/arthralgia  Skin: No  Rash, No other wounds/concerning lesions  Genitourinary: No  incontinence, No  abnormal genital bleeding, No abnormal genital discharge  Hem/Onc: No  easy bruising/bleeding, No  abnormal lymph node  Endocrine: No cold intolerance,  No heat intolerance. No polyuria/polydipsia/polyphagia   Neurologic: No  weakness, No  dizziness, No  slurred speech/focal weakness/facial droop  Psychiatric: No  concerns with depression, No  concerns with anxiety, No sleep problems, No mood problems  Exam:  BP (!) 150/93   Pulse 75   Temp 97.7 F (36.5 C) (Oral)   Ht 5' 9.5" (1.765 m)   Wt 238 lb  (108 kg)   BMI 34.64 kg/m   Constitutional: VS see above. General Appearance: alert, well-developed, well-nourished, NAD  Eyes: Normal lids and conjunctive, non-icteric sclera  Ears, Nose, Mouth, Throat: MMM, Normal external inspection ears/nares/mouth/lips/gums. TM normal bilaterally. Pharynx/tonsils no erythema, no exudate. Nasal mucosa normal.   Neck: No masses, trachea midline. No thyroid enlargement. No tenderness/mass appreciated. No lymphadenopathy  Respiratory: Normal respiratory effort. no wheeze, no rhonchi, no rales  Cardiovascular: S1/S2 normal, no murmur, no rub/gallop auscultated. RRR. No lower extremity edema.   Gastrointestinal: Nontender, no masses. No hepatomegaly, no splenomegaly. No hernia appreciated. Bowel sounds normal. Rectal exam deferred.   Musculoskeletal: Gait normal. No clubbing/cyanosis of digits.   Neurological: Normal balance/coordination. No tremor. No cranial nerve deficit on limited exam. Motor and sensation intact and symmetric. Cerebellar reflexes intact.   Skin: warm, dry, intact. No rash/ulcer. No concerning nevi or subq nodules on limited exam.    Psychiatric: Normal judgment/insight. Normal mood and affect. Oriented x3.     ASSESSMENT/PLAN: The primary encounter diagnosis was Annual physical exam. Diagnoses of Type 2 diabetes mellitus without complication, without long-term current use of insulin ([place]), Prostate cancer screening, Hyperlipidemia associated with type 2 diabetes mellitus ([place]), Screening for malignant neoplasm of colon, and Former smoker were also pertinent to this visit.   Orders Placed This Encounter  Procedures  . [place] Screening AAA  . CBC  . COMPLETE METABOLIC PANEL WITH GFR  . Lipid panel  . PSA, Total with Reflex to PSA, Free  . Ambulatory referral to Gastroenterology    Meds ordered this encounter  Medications  . Zoster Vaccine Adjuvanted [facility]) injection    Sig: Inject 0.5 mLs into the muscle once for 1 dose.  Repeat in 2 to 6 months    Dispense:  0.5 mL    Refill:  1    Please fax copy of immunization administration to Dr Sheppard Coil [phone]  . pneumococcal 23 valent vaccine (PNEUMOVAX 23) 25 MCG/0.5ML injection    Sig: Inject 0.5 mLs into the muscle once for 1 dose.    Dispense:  0.5 mL    Refill:  0    Please fax copy of immunization administration to Dr Sheppard Coil [phone]  . Tdap (ADACEL) 10-02-13.5 LF-MCG/0.5 injection    Sig: Inject 0.5 mLs into the muscle once for 1 dose.    Dispense:  0.5 mL    Refill:  0    Please fax copy of immunization administration to Dr Sheppard Coil [phone]    Patient Instructions  General Preventive Care  Most recent routine screening lipids/other labs: ordered today.   Everyone should have blood pressure checked once per year.   Tobacco: don't!   Alcohol: responsible moderation is ok for most adults - if you have concerns about your alcohol intake, please talk to me!   Exercise: as tolerated to reduce risk of  cardiovascular disease and diabetes. Strength training will also prevent osteoporosis.   Mental health: if need for mental health care (medicines, counseling, other), or concerns about moods, please let me know!   Sexual health: if need for STD testing, or if concerns with libido/pain problems, please let me know!   Advanced Directive: Living Will and/or Healthcare Power of Attorney recommended for all adults, regardless of age or health.  Vaccines  Flu vaccine: recommended for almost everyone, every fall.   Shingles vaccine: Shingrix recommended after age 91.   Pneumonia vaccines: Pneumovax recommended after age 69  Tetanus booster: Tdap recommended every 10 years.  Cancer screenings   Colon cancer screening: recommended for everyone at age 31, you are due!   Prostate cancer screening: PSA blood test age 22-71  Lung cancer screening: former smoker, but not heavy enough a smoker to qualify for screening.  Infection  screenings . HIV: recommended screening at least once age 60-65, done in 2012 . Gonorrhea/Chlamydia: screening as needed . Hepatitis C: recommended once for anyone born 78-1964 . TB: certain at-risk populations, or depending on work requirements and/or travel history Other . Bone Density Test: recommended for men at age 61, sooner depending on risk factors . Abdominal Aortic Aneurysm: screening with ultrasound recommended once for men age 89-75 who have ever smoked        Visit summary with medication list and pertinent instructions was printed for patient to review. All questions at time of visit were answered - patient instructed to contact office with any additional concerns or updates. ER/RTC precautions were reviewed with the patient.    Please note: voice recognition software was used to produce this document, and typos may escape review. Please contact Dr. Sheppard Coil for any needed clarifications.     Follow-up plan: Return in about 6 months (around 11/07/2018) for recheck A1C and blood pressure . 6 months    There are other unrelated non-urgent complaints of knee pain, but due to the busy schedule and the amount of time I've already spent with him, time does not permit me to address these routine issues at today's visit. I've requested he schedule another appointment with sports medicine to review these additional issues.

## 2018-05-08 NOTE — Patient Instructions (Addendum)
General Preventive Care  Most recent routine screening lipids/other labs: ordered today.   Everyone should have blood pressure checked once per year.   Tobacco: don't!   Alcohol: responsible moderation is ok for most adults - if you have concerns about your alcohol intake, please talk to me!   Exercise: as tolerated to reduce risk of cardiovascular disease and diabetes. Strength training will also prevent osteoporosis.   Mental health: if need for mental health care (medicines, counseling, other), or concerns about moods, please let me know!   Sexual health: if need for STD testing, or if concerns with libido/pain problems, please let me know!   Advanced Directive: Living Will and/or Healthcare Power of Attorney recommended for all adults, regardless of age or health.  Vaccines  Flu vaccine: recommended for almost everyone, every fall.   Shingles vaccine: Shingrix recommended after age 73.   Pneumonia vaccines: Pneumovax recommended after age 69  Tetanus booster: Tdap recommended every 10 years.  Cancer screenings   Colon cancer screening: recommended for everyone at age 3, you are due!   Prostate cancer screening: PSA blood test age 85-71  Lung cancer screening: former smoker, but not heavy enough a smoker to qualify for screening.  Infection screenings . HIV: recommended screening at least once age 11-65, done in 2012 . Gonorrhea/Chlamydia: screening as needed . Hepatitis C: recommended once for anyone born 59-1964 . TB: certain at-risk populations, or depending on work requirements and/or travel history Other . Bone Density Test: recommended for men at age 75, sooner depending on risk factors . Abdominal Aortic Aneurysm: screening with ultrasound recommended once for men age 70-75 who have ever smoked

## 2018-05-08 NOTE — Telephone Encounter (Signed)
Pt called stating he needs 90 day supply RF sent to [facility] for Metformin, Omeprazole, and Simvastatin  RX pended for your review. Please send if appropriate

## 2018-05-09 MED ORDER — METFORMIN HCL 1000 MG PO TABS: 1000.0000 mg | ORAL_TABLET | Freq: Two times a day (BID) | ORAL | 1 refills | Status: DC

## 2018-05-09 MED ORDER — OMEPRAZOLE 40 MG PO CPDR: 40.0000 mg | DELAYED_RELEASE_CAPSULE | Freq: Every day | ORAL | 1 refills | Status: DC

## 2018-05-09 MED ORDER — SIMVASTATIN 40 MG PO TABS: 40.0000 mg | ORAL_TABLET | Freq: Every day | ORAL | 1 refills | Status: DC

## 2018-05-11 LAB — CBC
HCT: 38.3 % — ABNORMAL LOW (ref 38.5–50.0)
HEMOGLOBIN: 12.8 g/dL — AB (ref 13.2–17.1)
MCH: 26 pg — ABNORMAL LOW (ref 27.0–33.0)
MCHC: 33.4 g/dL (ref 32.0–36.0)
MCV: 77.8 fL — ABNORMAL LOW (ref 80.0–100.0)
MPV: 11.5 fL (ref 7.5–12.5)
PLATELETS: 285 10*3/uL (ref 140–400)
RBC: 4.92 10*6/uL (ref 4.20–5.80)
RDW: 14.4 % (ref 11.0–15.0)
WBC: 6.9 10*3/uL (ref 3.8–10.8)

## 2018-05-11 LAB — COMPLETE METABOLIC PANEL WITH GFR
AG Ratio: 1.7 (calc) (ref 1.0–2.5)
ALT: 20 U/L (ref 9–46)
AST: 21 U/L (ref 10–35)
Albumin: 4.7 g/dL (ref 3.6–5.1)
Alkaline phosphatase (APISO): 84 U/L (ref 40–115)
BUN: 19 mg/dL (ref 7–25)
CO2: 27 mmol/L (ref 20–32)
Calcium: 9.9 mg/dL (ref 8.6–10.3)
Chloride: 99 mmol/L (ref 98–110)
Creat: 0.87 mg/dL (ref 0.70–1.25)
GFR, Est African American: 104 mL/min/{1.73_m2} (ref >=60)
GFR, Est Non African American: 90 mL/min/{1.73_m2} (ref >=60)
Globulin: 2.7 g/dL (calc) (ref 1.9–3.7)
Glucose, Bld: 115 mg/dL — ABNORMAL HIGH (ref 65–99)
Potassium: 4.1 mmol/L (ref 3.5–5.3)
Sodium: 138 mmol/L (ref 135–146)
Total Bilirubin: 0.4 mg/dL (ref 0.2–1.2)
Total Protein: 7.4 g/dL (ref 6.1–8.1)

## 2018-05-11 LAB — LIPID PANEL
Cholesterol: 173 mg/dL (ref <200)
HDL: 46 mg/dL (ref >40)
LDL Cholesterol (Calc): 99 mg/dL (calc)
NON-HDL CHOLESTEROL (CALC): 127 mg/dL (ref <130)
Total CHOL/HDL Ratio: 3.8 (calc) (ref <5.0)
Triglycerides: 186 mg/dL — ABNORMAL HIGH (ref <150)

## 2018-05-11 LAB — PSA, TOTAL WITH REFLEX TO PSA, FREE: PSA, Total: 0.5 ng/mL (ref <=4.0)

## 2018-05-11 NOTE — Telephone Encounter (Signed)
Pt advised RX sent.

## 2018-05-13 ENCOUNTER — Encounter: Payer: Self-pay | Admitting: Gastroenterology

## 2018-05-15 ENCOUNTER — Encounter: Payer: Self-pay | Admitting: Osteopathic Medicine

## 2018-05-15 ENCOUNTER — Ambulatory Visit (INDEPENDENT_AMBULATORY_CARE_PROVIDER_SITE_OTHER): Payer: Managed Care, Other (non HMO)

## 2018-05-15 DIAGNOSIS — Z87891 Personal history of nicotine dependence: Secondary | ICD-10-CM

## 2018-05-15 DIAGNOSIS — I77819 Aortic ectasia, unspecified site: Secondary | ICD-10-CM

## 2018-05-15 DIAGNOSIS — I700 Atherosclerosis of aorta: Secondary | ICD-10-CM | POA: Diagnosis not present

## 2018-05-15 DIAGNOSIS — I77811 Abdominal aortic ectasia: Secondary | ICD-10-CM

## 2018-05-15 HISTORY — DX: Abdominal aortic ectasia: I77.811

## 2018-05-22 ENCOUNTER — Ambulatory Visit: Payer: Managed Care, Other (non HMO)

## 2018-05-22 VITALS — Ht 70.0 in | Wt 246.0 lb

## 2018-05-22 DIAGNOSIS — Z1211 Encounter for screening for malignant neoplasm of colon: Secondary | ICD-10-CM

## 2018-05-22 MED ORDER — NA SULFATE-K SULFATE-MG SULF 17.5-3.13-1.6 GM/177ML PO SOLN: 1.0000 | Freq: Once | ORAL | 0 refills | Status: AC

## 2018-05-22 NOTE — Progress Notes (Signed)
Per pt, no allergies to soy or egg products.Pt not taking any weight loss meds or using  O2 at home.  Pt refused emmi video. 

## 2018-06-03 DIAGNOSIS — Z86010 Personal history of colonic polyps: Secondary | ICD-10-CM

## 2018-06-03 DIAGNOSIS — Z860101 Personal history of adenomatous and serrated colon polyps: Secondary | ICD-10-CM

## 2018-06-03 HISTORY — DX: Personal history of adenomatous and serrated colon polyps: Z86.0101

## 2018-06-03 HISTORY — DX: Personal history of colonic polyps: Z86.010

## 2018-06-04 ENCOUNTER — Encounter: Payer: Self-pay | Admitting: Gastroenterology

## 2018-06-12 ENCOUNTER — Ambulatory Visit (AMBULATORY_SURGERY_CENTER): Payer: Managed Care, Other (non HMO) | Admitting: Gastroenterology

## 2018-06-12 ENCOUNTER — Encounter: Payer: Self-pay | Admitting: Gastroenterology

## 2018-06-12 VITALS — BP 137/97 | HR 73 | Temp 98.0°F | Resp 13 | Ht 70.0 in | Wt 246.0 lb

## 2018-06-12 DIAGNOSIS — D124 Benign neoplasm of descending colon: Secondary | ICD-10-CM

## 2018-06-12 DIAGNOSIS — K635 Polyp of colon: Secondary | ICD-10-CM

## 2018-06-12 DIAGNOSIS — D123 Benign neoplasm of transverse colon: Secondary | ICD-10-CM

## 2018-06-12 DIAGNOSIS — Z1211 Encounter for screening for malignant neoplasm of colon: Secondary | ICD-10-CM

## 2018-06-12 HISTORY — PX: COLONOSCOPY: SHX174

## 2018-06-12 MED ORDER — SODIUM CHLORIDE 0.9 % IV SOLN: 500.0000 mL | Freq: Once | INTRAVENOUS | Status: DC

## 2018-06-12 NOTE — Progress Notes (Signed)
Called to room to assist during endoscopic procedure.  Patient ID and intended procedure confirmed with present staff. Received instructions for my participation in the procedure from the performing physician.  

## 2018-06-12 NOTE — Progress Notes (Signed)
Pt's states no medical or surgical changes since previsit or office visit. 

## 2018-06-12 NOTE — Progress Notes (Signed)
PT taken to PACU. Monitors in place. VSS. Report given to RN. 

## 2018-06-12 NOTE — Patient Instructions (Signed)
YOU HAD AN ENDOSCOPIC PROCEDURE TODAY AT [facility]:   Refer to the procedure report that was given to you for any specific questions about what was found during the examination.  If the procedure report does not answer your questions, please call your gastroenterologist to clarify.  If you requested that your care partner not be given the details of your procedure findings, then the procedure report has been included in a sealed envelope for you to review at your convenience later.  YOU SHOULD EXPECT: Some feelings of bloating in the abdomen. Passage of more gas than usual.  Walking can help get rid of the air that was put into your GI tract during the procedure and reduce the bloating. If you had a lower endoscopy (such as a colonoscopy or flexible sigmoidoscopy) you may notice spotting of blood in your stool or on the toilet paper. If you underwent a bowel prep for your procedure, you may not have a normal bowel movement for a few days.  Please Note:  You might notice some irritation and congestion in your nose or some drainage.  This is from the oxygen used during your procedure.  There is no need for concern and it should clear up in a day or so.  SYMPTOMS TO REPORT IMMEDIATELY:   Following lower endoscopy (colonoscopy or flexible sigmoidoscopy):  Excessive amounts of blood in the stool  Significant tenderness or worsening of abdominal pains  Swelling of the abdomen that is new, acute  Fever of 100F or higher   For urgent or emergent issues, a gastroenterologist can be reached at any hour by calling [phone].   DIET:  We do recommend a small meal at first, but then you may proceed to your regular diet.  Drink plenty of fluids but you should avoid alcoholic beverages for 24 hours.  ACTIVITY:  You should plan to take it easy for the rest of today and you should NOT DRIVE or use heavy machinery until tomorrow (because of the sedation medicines used during the test).     FOLLOW UP: Our staff will call the number listed on your records the next business day following your procedure to check on you and address any questions or concerns that you may have regarding the information given to you following your procedure. If we do not reach you, we will leave a message.  However, if you are feeling well and you are not experiencing any problems, there is no need to return our call.  We will assume that you have returned to your regular daily activities without incident.  If any biopsies were taken you will be contacted by phone or by letter within the next 1-3 weeks.  Please call us at [phone] if you have not heard about the biopsies in 3 weeks.    SIGNATURES/CONFIDENTIALITY: You and/or your care partner have signed paperwork which will be entered into your electronic medical record.  These signatures attest to the fact that that the information above on your After Visit Summary has been reviewed and is understood.  Full responsibility of the confidentiality of this discharge information lies with you and/or your care-partner.    Please see handouts on polyps, diverticulosis, and hemorrhoids.    Thank you for allowing [place] to provide your healthcare today.

## 2018-06-12 NOTE — Op Note (Addendum)
Patient Name: David Pearson Procedure Date: 06/12/2018 9:37 AM MRN: [id number] Endoscopist: Remo Lipps P. Havery Moros , MD Age: 68 Referring MD:  Date of Birth: 1951-04-22 Gender: Male Account #: [id number] Procedure:                Colonoscopy Indications:              Screening for colorectal malignant neoplasm Medicines:                Monitored Anesthesia Care Procedure:                Pre-Anesthesia Assessment:                           - Prior to the procedure, a History and Physical                            was performed, and patient medications and                            allergies were reviewed. The patient's tolerance of                            previous anesthesia was also reviewed. The risks                            and benefits of the procedure and the sedation                            options and risks were discussed with the patient.                            All questions were answered, and informed consent                            was obtained. Prior Anticoagulants: The patient has                            taken no previous anticoagulant or antiplatelet                            agents. ASA Grade Assessment: II - A patient with                            mild systemic disease. After reviewing the risks                            and benefits, the patient was deemed in                            satisfactory condition to undergo the procedure.                           After obtaining informed consent, the colonoscope  was passed under direct vision. Throughout the                            procedure, the patient's blood pressure, pulse, and                            oxygen saturations were monitored continuously. The                            Colonoscope was introduced through the anus and                            advanced to the the cecum, identified by                            appendiceal orifice and  ileocecal valve. The                            colonoscopy was performed without difficulty. The                            patient tolerated the procedure well. The quality                            of the bowel preparation was adequate. The                            ileocecal valve, appendiceal orifice, and rectum                            were photographed. Scope In: 9:46:23 AM Scope Out: 10:10:25 AM Scope Withdrawal Time: 0 hours 21 minutes 31 seconds  Total Procedure Duration: 0 hours 24 minutes 2 seconds  Findings:                 The perianal and digital rectal examinations were                            normal.                           Multiple medium-mouthed diverticula were found in                            the transverse colon, hepatic flexure and left                            colon.                           A 10 mm polyp was found in the hepatic flexure. The                            polyp was sessile. The polyp was removed with a  cold snare. Resection and retrieval were complete.                           A 4 mm polyp was found in the hepatic flexure. The                            polyp was flat and snare would not grasp it. The                            polyp was removed with a cold biopsy forceps.                            Resection and retrieval were complete.                           A 3 mm polyp was found in the descending colon. The                            polyp was sessile. The polyp was removed with a                            cold biopsy forceps. Resection and retrieval were                            complete.                           Internal hemorrhoids were found during retroflexion.                           The exam was otherwise without abnormality. Complications:            No immediate complications. Estimated blood loss:                            Minimal. Estimated Blood Loss:     Estimated blood loss was  minimal. Impression:               - Diverticulosis in the transverse colon, at the                            hepatic flexure and in the left colon.                           - One 10 mm polyp at the hepatic flexure, removed                            with a cold snare. Resected and retrieved.                           - One 4 mm polyp at the hepatic flexure, removed                            with a cold biopsy  forceps. Resected and retrieved.                           - One 3 mm polyp in the descending colon, removed                            with a cold biopsy forceps. Resected and retrieved.                           - Internal hemorrhoids.                           - The examination was otherwise normal. Recommendation:           - Patient has a contact number available for                            emergencies. The signs and symptoms of potential                            delayed complications were discussed with the                            patient. Return to normal activities tomorrow.                            Written discharge instructions were provided to the                            patient.                           - Resume previous diet.                           - Continue present medications.                           - Await pathology results. Remo Lipps P. Armbruster, MD 06/12/2018 10:14:30 AM This report has been signed electronically.

## 2018-06-15 ENCOUNTER — Telehealth: Payer: Self-pay | Admitting: *Deleted

## 2018-06-15 ENCOUNTER — Telehealth: Payer: Self-pay

## 2018-06-15 NOTE — Telephone Encounter (Signed)
Attempted to reach pt. With follow-up call following endoscopic procedure 06/12/2018.  LM on pt. Voice mail.  Will try to reach pt. Again later today.

## 2018-06-15 NOTE — Telephone Encounter (Signed)
  Follow up Call-  Call back number 06/12/2018  Post procedure Call Back phone  # [phone]  Permission to leave phone message Yes     Patient questions:  Do you have a fever, pain , or abdominal swelling? No. Pain Score  0 *  Have you tolerated food without any problems? Yes.    Have you been able to return to your normal activities? Yes.    Do you have any questions about your discharge instructions: Diet   No. Medications  No. Follow up visit  No.  Do you have questions or concerns about your Care? No.  Actions: * If pain score is 4 or above: No action needed, pain <4.

## 2018-10-01 ENCOUNTER — Encounter: Payer: Self-pay | Admitting: Osteopathic Medicine

## 2018-10-01 MED ORDER — SILDENAFIL CITRATE 100 MG PO TABS: 50.0000 mg | ORAL_TABLET | ORAL | 3 refills | Status: DC | PRN

## 2018-10-01 MED ORDER — OMEPRAZOLE 40 MG PO CPDR: 40.0000 mg | DELAYED_RELEASE_CAPSULE | Freq: Every day | ORAL | 1 refills | Status: DC

## 2018-10-01 MED ORDER — SIMVASTATIN 40 MG PO TABS: 40.0000 mg | ORAL_TABLET | Freq: Every day | ORAL | 1 refills | Status: DC

## 2018-10-20 ENCOUNTER — Telehealth: Payer: Self-pay | Admitting: Osteopathic Medicine

## 2018-10-20 DIAGNOSIS — E785 Hyperlipidemia, unspecified: Secondary | ICD-10-CM

## 2018-10-20 DIAGNOSIS — E1169 Type 2 diabetes mellitus with other specified complication: Secondary | ICD-10-CM

## 2018-10-20 DIAGNOSIS — E119 Type 2 diabetes mellitus without complications: Secondary | ICD-10-CM

## 2018-10-20 DIAGNOSIS — D649 Anemia, unspecified: Secondary | ICD-10-CM

## 2018-10-20 NOTE — Telephone Encounter (Signed)
Ok orders are in

## 2018-10-20 NOTE — Telephone Encounter (Signed)
Patient can only come in on Friday, asked if you could put in the lab orders so he can go do it before the virtual appt on 6/12.

## 2018-10-21 NOTE — Telephone Encounter (Signed)
Pt has been updated regarding lab order. No other inquiries during call.

## 2018-11-02 ENCOUNTER — Other Ambulatory Visit: Payer: Self-pay | Admitting: Osteopathic Medicine

## 2018-11-02 NOTE — Telephone Encounter (Signed)
Routing medication refill to provider - Patient never got lab work done.

## 2018-11-02 NOTE — Telephone Encounter (Signed)
OK to approve, he has upcoming appt

## 2018-11-05 NOTE — Addendum Note (Signed)
Addended by: Maryla Morrow on: 11/05/2018 08:05 AM   Modules accepted: Orders

## 2018-11-06 LAB — LIPID PANEL
Cholesterol: 174 mg/dL (ref <200)
HDL: 35 mg/dL — ABNORMAL LOW (ref >=40)
LDL Cholesterol (Calc): 101 mg/dL (calc) — ABNORMAL HIGH
Non-HDL Cholesterol (Calc): 139 mg/dL (calc) — ABNORMAL HIGH (ref <130)
Total CHOL/HDL Ratio: 5 (calc) — ABNORMAL HIGH (ref <5.0)
Triglycerides: 268 mg/dL — ABNORMAL HIGH (ref <150)

## 2018-11-06 LAB — COMPLETE METABOLIC PANEL WITH GFR
AG Ratio: 1.7 (calc) (ref 1.0–2.5)
ALT: 16 U/L (ref 9–46)
AST: 16 U/L (ref 10–35)
Albumin: 4.5 g/dL (ref 3.6–5.1)
Alkaline phosphatase (APISO): 79 U/L (ref 35–144)
BUN: 21 mg/dL (ref 7–25)
CO2: 29 mmol/L (ref 20–32)
Calcium: 9.9 mg/dL (ref 8.6–10.3)
Chloride: 102 mmol/L (ref 98–110)
Creat: 0.94 mg/dL (ref 0.70–1.25)
GFR, Est African American: 98 mL/min/{1.73_m2} (ref >=60)
GFR, Est Non African American: 84 mL/min/{1.73_m2} (ref >=60)
Globulin: 2.6 g/dL (calc) (ref 1.9–3.7)
Glucose, Bld: 110 mg/dL — ABNORMAL HIGH (ref 65–99)
Potassium: 4.9 mmol/L (ref 3.5–5.3)
Sodium: 141 mmol/L (ref 135–146)
Total Bilirubin: 0.5 mg/dL (ref 0.2–1.2)
Total Protein: 7.1 g/dL (ref 6.1–8.1)

## 2018-11-06 LAB — CBC
HCT: 37.9 % — ABNORMAL LOW (ref 38.5–50.0)
Hemoglobin: 12.6 g/dL — ABNORMAL LOW (ref 13.2–17.1)
MCH: 26.2 pg — ABNORMAL LOW (ref 27.0–33.0)
MCHC: 33.2 g/dL (ref 32.0–36.0)
MCV: 78.8 fL — ABNORMAL LOW (ref 80.0–100.0)
MPV: 11.5 fL (ref 7.5–12.5)
Platelets: 254 10*3/uL (ref 140–400)
RBC: 4.81 10*6/uL (ref 4.20–5.80)
RDW: 15.5 % — ABNORMAL HIGH (ref 11.0–15.0)
WBC: 6.9 10*3/uL (ref 3.8–10.8)

## 2018-11-06 LAB — HEMOGLOBIN A1C
Hgb A1c MFr Bld: 6.3 % of total Hgb — ABNORMAL HIGH (ref <5.7)
Mean Plasma Glucose: 134 (calc)
eAG (mmol/L): 7.4 (calc)

## 2018-11-06 LAB — IRON,TIBC AND FERRITIN PANEL
%SAT: 21 % (calc) (ref 20–48)
Ferritin: 10 ng/mL — ABNORMAL LOW (ref 24–380)
Iron: 101 ug/dL (ref 50–180)
TIBC: 474 mcg/dL (calc) — ABNORMAL HIGH (ref 250–425)

## 2018-11-06 LAB — TEST AUTHORIZATION

## 2018-11-13 ENCOUNTER — Ambulatory Visit (INDEPENDENT_AMBULATORY_CARE_PROVIDER_SITE_OTHER): Payer: Managed Care, Other (non HMO) | Admitting: Osteopathic Medicine

## 2018-11-13 ENCOUNTER — Ambulatory Visit: Payer: Managed Care, Other (non HMO) | Admitting: Osteopathic Medicine

## 2018-11-13 ENCOUNTER — Encounter: Payer: Self-pay | Admitting: Osteopathic Medicine

## 2018-11-13 DIAGNOSIS — E782 Mixed hyperlipidemia: Secondary | ICD-10-CM | POA: Diagnosis not present

## 2018-11-13 DIAGNOSIS — R7303 Prediabetes: Secondary | ICD-10-CM

## 2018-11-13 DIAGNOSIS — Z0289 Encounter for other administrative examinations: Secondary | ICD-10-CM

## 2018-11-13 MED ORDER — FERROUS SULFATE 325 (65 FE) MG PO TBEC: 325.0000 mg | DELAYED_RELEASE_TABLET | Freq: Every day | ORAL | 3 refills | Status: DC

## 2018-11-13 NOTE — Progress Notes (Signed)
Patient had to be rescheduled for same day, please disregard this note

## 2018-11-13 NOTE — Progress Notes (Signed)
Virtual Visit via Phone Note  I connected with    David Pearson on 11/13/18 at 10:10 by a telemedicine application and verified that I am speaking with the correct person using two identifiers.  Patient is at home I am in office   I discussed the limitations of evaluation and management by telemedicine and the availability of in person appointments. The patient expressed understanding and agreed to proceed.  History of Present Illness: David Pearson is a 68 y.o. male who would like to discuss labs, DM follow-up  Patient doing well, recent lab results reviewed in detail.  Cholesterol and A1c are both stable.  Patient states that he was never told he had an A1c greater than 6.5, he just said he was "officially a diabetic because I had 3 abnormal A1c's in the prediabetic range" prior to establishing care with me.         Observations/Objective: There were no vitals taken for this visit. BP Readings from Last 3 Encounters:  06/12/18 (!) 137/97  05/08/18 (!) 150/93  03/13/18 114/79   Exam: Normal Speech.    Lab and Radiology Results   Recent Results (from the past 2160 hour(s))  CBC     Status: Abnormal   Collection Time: 11/04/18  8:09 AM  Result Value Ref Range   WBC 6.9 3.8 - 10.8 Thousand/uL   RBC 4.81 4.20 - 5.80 Million/uL   Hemoglobin 12.6 (L) 13.2 - 17.1 g/dL   HCT 37.9 (L) 38.5 - 50.0 %   MCV 78.8 (L) 80.0 - 100.0 fL   MCH 26.2 (L) 27.0 - 33.0 pg   MCHC 33.2 32.0 - 36.0 g/dL   RDW 15.5 (H) 11.0 - 15.0 %   Platelets 254 140 - 400 Thousand/uL   MPV 11.5 7.5 - 12.5 fL  COMPLETE METABOLIC PANEL WITH GFR     Status: Abnormal   Collection Time: 11/04/18  8:09 AM  Result Value Ref Range   Glucose, Bld 110 (H) 65 - 99 mg/dL    Comment: .            Fasting reference interval . For someone without known diabetes, a glucose value between 100 and 125 mg/dL is consistent with prediabetes and should be confirmed with a follow-up test. .    BUN 21 7 - 25  mg/dL   Creat 0.94 0.70 - 1.25 mg/dL    Comment: For patients >60 years of age, the reference limit for Creatinine is approximately 13% higher for people identified as African-American. .    GFR, Est Non African American 84 > OR = 60 mL/min/1.30m2   GFR, Est African American 98 > OR = 60 mL/min/1.22m2   BUN/Creatinine Ratio NOT APPLICABLE 6 - 22 (calc)   Sodium 141 135 - 146 mmol/L   Potassium 4.9 3.5 - 5.3 mmol/L   Chloride 102 98 - 110 mmol/L   CO2 29 20 - 32 mmol/L   Calcium 9.9 8.6 - 10.3 mg/dL   Total Protein 7.1 6.1 - 8.1 g/dL   Albumin 4.5 3.6 - 5.1 g/dL   Globulin 2.6 1.9 - 3.7 g/dL (calc)   AG Ratio 1.7 1.0 - 2.5 (calc)   Total Bilirubin 0.5 0.2 - 1.2 mg/dL   Alkaline phosphatase (APISO) 79 35 - 144 U/L   AST 16 10 - 35 U/L   ALT 16 9 - 46 U/L  Lipid panel     Status: Abnormal   Collection Time: 11/04/18  8:09 AM  Result Value Ref Range   Cholesterol 174 <200 mg/dL   HDL 35 (L) > OR = 40 mg/dL   Triglycerides 268 (H) <150 mg/dL    Comment: . If a non-fasting specimen was collected, consider repeat triglyceride testing on a fasting specimen if clinically indicated.  Yates Decamp et al. J. of Clin. Lipidol. 4481;8:563-149. Marland Kitchen    LDL Cholesterol (Calc) 101 (H) mg/dL (calc)    Comment: Reference range: <100 . Desirable range <100 mg/dL for primary prevention;   <70 mg/dL for patients with CHD or diabetic patients  with > or = 2 CHD risk factors. Marland Kitchen LDL-C is now calculated using the Martin-Hopkins  calculation, which is a validated novel method providing  better accuracy than the Friedewald equation in the  estimation of LDL-C.  Cresenciano Genre et al. Annamaria Helling. 7026;378(58): 2061-2068  (http://education.[facility].com/faq/FAQ164)    Total CHOL/HDL Ratio 5.0 (H) <5.0 (calc)   Non-HDL Cholesterol (Calc) 139 (H) <130 mg/dL (calc)    Comment: For patients with diabetes plus 1 major ASCVD risk  factor, treating to a non-HDL-C goal of <100 mg/dL  (LDL-C of <70 mg/dL) is  considered a therapeutic  option.   Hemoglobin A1c     Status: Abnormal   Collection Time: 11/04/18  8:09 AM  Result Value Ref Range   Hgb A1c MFr Bld 6.3 (H) <5.7 % of total Hgb    Comment: For someone without known diabetes, a hemoglobin  A1c value between 5.7% and 6.4% is consistent with prediabetes and should be confirmed with a  follow-up test. . For someone with known diabetes, a value <7% indicates that their diabetes is well controlled. A1c targets should be individualized based on duration of diabetes, age, comorbid conditions, and other considerations. . This assay result is consistent with an increased risk of diabetes. . Currently, no consensus exists regarding use of hemoglobin A1c for diagnosis of diabetes for children. .    Mean Plasma Glucose 134 (calc)   eAG (mmol/L) 7.4 (calc)  Iron, TIBC and Ferritin Panel     Status: Abnormal   Collection Time: 11/04/18  8:09 AM  Result Value Ref Range   Iron 101 50 - 180 mcg/dL   TIBC 474 (H) 250 - 425 mcg/dL (calc)   %SAT 21 20 - 48 % (calc)   Ferritin 10 (L) 24 - 380 ng/mL  TEST AUTHORIZATION     Status: None   Collection Time: 11/04/18  8:09 AM  Result Value Ref Range   TEST NAME: IRON, TIBC AND FERRITIN PANEL    TEST CODE: [id number]    CLIENT CONTACT: VANICIA FERNANDEZ    REPORT ALWAYS MESSAGE SIGNATURE      Comment: . The laboratory testing on this patient was verbally requested or confirmed by the ordering physician or his or her authorized representative after contact with an employee of [employer]. Federal regulations require that we maintain on file written authorization for all laboratory testing.  Accordingly we are asking that the ordering physician or his or her authorized representative sign a copy of this report and promptly return it to the client service representative. . . Signature:____________________________________________________ . Please fax this signed page to [phone]  or return it via your [employer] courier.       Assessment and Plan: 68 y.o. male with The primary encounter diagnosis was Prediabetes. A diagnosis of Mixed hyperlipidemia was also pertinent to this visit.   PDMP not reviewed this encounter. Orders Placed This Encounter  Procedures  . CBC  .  COMPLETE METABOLIC PANEL WITH GFR  . Hemoglobin A1c  . Fe+TIBC+Fer   Meds ordered this encounter  Medications  . ferrous sulfate 325 (65 FE) MG EC tablet    Sig: Take 1 tablet (325 mg total) by mouth daily with breakfast.    Dispense:  90 tablet    Refill:  3   There are no Patient Instructions on file for this visit.  Instructions sent via MyChart. If MyChart not available, pt was given option for info via personal e-mail w/ no guarantee of protected health info over unsecured e-mail communication, and MyChart sign-up instructions were included.   Follow Up Instructions: Return in about 6 months (around 05/15/2019) for Recheck labs in 6 months/annual physical.  Lab orders are already in place.    I discussed the assessment and treatment plan with the patient. The patient was provided an opportunity to ask questions and all were answered. The patient agreed with the plan and demonstrated an understanding of the instructions.   The patient was advised to call back or seek an in-person evaluation if any new concerns, if symptoms worsen or if the condition fails to improve as anticipated.  25 minutes of non-face-to-face time was provided during this encounter.                      Historical information moved to improve visibility of documentation.  Past Medical History:  Diagnosis Date  . 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 Diabetes mellitus without complication ([place])   . Hyperlipidemia    Past Surgical History:  Procedure Laterality Date  . ABDOMINAL HERNIA REPAIR  03/12/2018  .  CHOLECYSTECTOMY N/A 03/12/2018   Procedure: LAPAROSCOPIC CHOLECYSTECTOMY;  Surgeon: Rolm Bookbinder, MD;  Location: [facility];  Service: General;  Laterality: N/A;  . HERNIA REPAIR     3 inguinal hernias  . TONSILLECTOMY     with adenoids   Social History   Tobacco Use  . Smoking status: Former Smoker    Years: 5.00    Types: Cigarettes    Quit date: 2014    Years since quitting: 6.4  . Smokeless tobacco: Never Used  Substance Use Topics  . Alcohol use: Yes    Alcohol/week: 4.0 standard drinks    Types: 4 Standard drinks or equivalent per week   family history includes Breast cancer in his maternal aunt; COPD in his mother and sister; Cancer in his father; Muscular dystrophy in his father.  Medications: Current Outpatient Medications  Medication Sig Dispense Refill  . cholecalciferol (VITAMIN D) 1000 units tablet Take 1,000 Units by mouth daily.    . metFORMIN (GLUCOPHAGE) 1000 MG tablet TAKE ONE TABLET BY MOUTH TWICE A DAY WITH MEALS 180 tablet 0  . Multiple Vitamin (MULTIVITAMIN WITH MINERALS) TABS tablet Take 1 tablet by mouth daily.    Marland Kitchen omeprazole (PRILOSEC) 40 MG capsule Take 1 capsule (40 mg total) by mouth daily. 90 capsule 1  . sildenafil (VIAGRA) 100 MG tablet Take 0.5-1 tablets (50-100 mg total) by mouth as needed for erectile dysfunction (for use prior to sexual activity). 30 tablet 3  . simvastatin (ZOCOR) 40 MG tablet Take 1 tablet (40 mg total) by mouth daily. 90 tablet 1  . vitamin E 100 UNIT capsule Take 450 Units by mouth daily.     . ferrous sulfate 325 (65 FE) MG EC tablet Take 1 tablet (325 mg total) by  mouth daily with breakfast. 90 tablet 3   No current facility-administered medications for this visit.    No Known Allergies  PDMP not reviewed this encounter. Orders Placed This Encounter  Procedures  . CBC  . COMPLETE METABOLIC PANEL WITH GFR  . Hemoglobin A1c  . Fe+TIBC+Fer   Meds ordered this encounter  Medications  . ferrous sulfate 325 (65 FE)  MG EC tablet    Sig: Take 1 tablet (325 mg total) by mouth daily with breakfast.    Dispense:  90 tablet    Refill:  3

## 2018-11-16 ENCOUNTER — Ambulatory Visit: Payer: Managed Care, Other (non HMO) | Admitting: Osteopathic Medicine

## 2018-12-04 ENCOUNTER — Encounter: Payer: Self-pay | Admitting: Osteopathic Medicine

## 2019-01-14 LAB — HM DIABETES EYE EXAM

## 2019-02-01 ENCOUNTER — Other Ambulatory Visit: Payer: Self-pay | Admitting: Osteopathic Medicine

## 2019-02-04 ENCOUNTER — Encounter: Payer: Self-pay | Admitting: Osteopathic Medicine

## 2019-02-25 DIAGNOSIS — H9313 Tinnitus, bilateral: Secondary | ICD-10-CM | POA: Insufficient documentation

## 2019-02-25 DIAGNOSIS — H903 Sensorineural hearing loss, bilateral: Secondary | ICD-10-CM | POA: Insufficient documentation

## 2019-02-25 DIAGNOSIS — H9190 Unspecified hearing loss, unspecified ear: Secondary | ICD-10-CM

## 2019-02-25 HISTORY — DX: Unspecified hearing loss, unspecified ear: H91.90

## 2019-03-24 ENCOUNTER — Telehealth: Payer: Self-pay | Admitting: Osteopathic Medicine

## 2019-03-24 ENCOUNTER — Other Ambulatory Visit: Payer: Self-pay

## 2019-03-24 MED ORDER — SIMVASTATIN 40 MG PO TABS: 40.0000 mg | ORAL_TABLET | Freq: Every day | ORAL | 0 refills | Status: DC

## 2019-03-24 MED ORDER — OMEPRAZOLE 40 MG PO CPDR: 40.0000 mg | DELAYED_RELEASE_CAPSULE | Freq: Every day | ORAL | 0 refills | Status: DC

## 2019-03-24 MED ORDER — METFORMIN HCL 1000 MG PO TABS: 1000.0000 mg | ORAL_TABLET | Freq: Two times a day (BID) | ORAL | 0 refills | Status: DC

## 2019-03-24 MED ORDER — SILDENAFIL CITRATE 100 MG PO TABS: 50.0000 mg | ORAL_TABLET | ORAL | 3 refills | Status: DC | PRN

## 2019-03-24 NOTE — Telephone Encounter (Signed)
Task completed med refills sent to local pharmacy (metformin, omeprazole, simvastatin and sildenafil). Pls update pt and inform no further refills without visit. Thanks.

## 2019-03-24 NOTE — Telephone Encounter (Signed)
Patient is asking for refills on meds and will call to reschedule physical

## 2019-04-09 ENCOUNTER — Encounter: Payer: Managed Care, Other (non HMO) | Admitting: Osteopathic Medicine

## 2019-06-04 DIAGNOSIS — M65312 Trigger thumb, left thumb: Secondary | ICD-10-CM

## 2019-06-04 HISTORY — DX: Trigger thumb, left thumb: M65.312

## 2019-07-30 ENCOUNTER — Other Ambulatory Visit: Payer: Self-pay | Admitting: Osteopathic Medicine

## 2019-08-27 LAB — HM DIABETES EYE EXAM

## 2019-10-28 ENCOUNTER — Other Ambulatory Visit: Payer: Self-pay | Admitting: Osteopathic Medicine

## 2019-11-14 ENCOUNTER — Other Ambulatory Visit: Payer: Self-pay | Admitting: Osteopathic Medicine

## 2019-11-18 ENCOUNTER — Other Ambulatory Visit: Payer: Self-pay

## 2019-11-18 MED ORDER — FERROUS SULFATE 325 (65 FE) MG PO TBEC: 325.0000 mg | DELAYED_RELEASE_TABLET | Freq: Every day | ORAL | 0 refills | Status: DC

## 2019-11-27 ENCOUNTER — Other Ambulatory Visit: Payer: Self-pay | Admitting: Osteopathic Medicine

## 2019-12-15 ENCOUNTER — Encounter: Payer: Self-pay | Admitting: Family Medicine

## 2019-12-15 ENCOUNTER — Ambulatory Visit (INDEPENDENT_AMBULATORY_CARE_PROVIDER_SITE_OTHER): Payer: Managed Care, Other (non HMO) | Admitting: Family Medicine

## 2019-12-15 ENCOUNTER — Other Ambulatory Visit: Payer: Self-pay

## 2019-12-15 VITALS — BP 123/78 | HR 63 | Temp 98.0°F | Resp 16 | Ht 70.0 in | Wt 243.8 lb

## 2019-12-15 DIAGNOSIS — Z125 Encounter for screening for malignant neoplasm of prostate: Secondary | ICD-10-CM | POA: Diagnosis not present

## 2019-12-15 DIAGNOSIS — E7800 Pure hypercholesterolemia, unspecified: Secondary | ICD-10-CM

## 2019-12-15 DIAGNOSIS — Z23 Encounter for immunization: Secondary | ICD-10-CM | POA: Diagnosis not present

## 2019-12-15 DIAGNOSIS — E119 Type 2 diabetes mellitus without complications: Secondary | ICD-10-CM

## 2019-12-15 DIAGNOSIS — Z862 Personal history of diseases of the blood and blood-forming organs and certain disorders involving the immune mechanism: Secondary | ICD-10-CM

## 2019-12-15 DIAGNOSIS — M65312 Trigger thumb, left thumb: Secondary | ICD-10-CM

## 2019-12-15 DIAGNOSIS — Z0001 Encounter for general adult medical examination with abnormal findings: Secondary | ICD-10-CM

## 2019-12-15 DIAGNOSIS — Z0000 Encounter for general adult medical examination without abnormal findings: Secondary | ICD-10-CM

## 2019-12-15 DIAGNOSIS — K429 Umbilical hernia without obstruction or gangrene: Secondary | ICD-10-CM

## 2019-12-15 DIAGNOSIS — M659 Synovitis and tenosynovitis, unspecified: Secondary | ICD-10-CM

## 2019-12-15 LAB — CBC WITH DIFFERENTIAL/PLATELET
Basophils Absolute: 0.1 10*3/uL (ref 0.0–0.1)
Basophils Relative: 0.7 % (ref 0.0–3.0)
Eosinophils Absolute: 0.2 10*3/uL (ref 0.0–0.7)
Eosinophils Relative: 2.6 % (ref 0.0–5.0)
HCT: 40.5 % (ref 39.0–52.0)
Hemoglobin: 13.9 g/dL (ref 13.0–17.0)
Lymphocytes Relative: 26.8 % (ref 12.0–46.0)
Lymphs Abs: 1.9 10*3/uL (ref 0.7–4.0)
MCHC: 34.4 g/dL (ref 30.0–36.0)
MCV: 85.5 fl (ref 78.0–100.0)
Monocytes Absolute: 0.5 10*3/uL (ref 0.1–1.0)
Monocytes Relative: 7.2 % (ref 3.0–12.0)
Neutro Abs: 4.3 10*3/uL (ref 1.4–7.7)
Neutrophils Relative %: 62.7 % (ref 43.0–77.0)
Platelets: 200 10*3/uL (ref 150.0–400.0)
RBC: 4.73 Mil/uL (ref 4.22–5.81)
RDW: 14.2 % (ref 11.5–15.5)
WBC: 6.9 10*3/uL (ref 4.0–10.5)

## 2019-12-15 LAB — COMPREHENSIVE METABOLIC PANEL
ALT: 28 U/L (ref 0–53)
AST: 22 U/L (ref 0–37)
Albumin: 4.5 g/dL (ref 3.5–5.2)
Alkaline Phosphatase: 70 U/L (ref 39–117)
BUN: 25 mg/dL — ABNORMAL HIGH (ref 6–23)
CO2: 31 mEq/L (ref 19–32)
Calcium: 9.9 mg/dL (ref 8.4–10.5)
Chloride: 101 mEq/L (ref 96–112)
Creatinine, Ser: 0.87 mg/dL (ref 0.40–1.50)
GFR: 87.26 mL/min (ref >60.00)
Glucose, Bld: 108 mg/dL — ABNORMAL HIGH (ref 70–99)
Potassium: 5 mEq/L (ref 3.5–5.1)
Sodium: 139 mEq/L (ref 135–145)
Total Bilirubin: 0.5 mg/dL (ref 0.2–1.2)
Total Protein: 6.6 g/dL (ref 6.0–8.3)

## 2019-12-15 LAB — PSA, MEDICARE: PSA: 0.61 ng/ml (ref 0.10–4.00)

## 2019-12-15 LAB — LIPID PANEL
Cholesterol: 176 mg/dL (ref 0–200)
HDL: 41.4 mg/dL (ref >39.00)
LDL Cholesterol: 102 mg/dL — ABNORMAL HIGH (ref 0–99)
NonHDL: 134.78
Total CHOL/HDL Ratio: 4
Triglycerides: 165 mg/dL — ABNORMAL HIGH (ref 0.0–149.0)
VLDL: 33 mg/dL (ref 0.0–40.0)

## 2019-12-15 LAB — HEMOGLOBIN A1C: Hgb A1c MFr Bld: 6.4 % (ref 4.6–6.5)

## 2019-12-15 MED ORDER — SIMVASTATIN 40 MG PO TABS: 40.0000 mg | ORAL_TABLET | Freq: Every day | ORAL | 3 refills | Status: DC

## 2019-12-15 MED ORDER — METFORMIN HCL 1000 MG PO TABS: ORAL_TABLET | ORAL | 3 refills | Status: DC

## 2019-12-15 MED ORDER — OMEPRAZOLE 40 MG PO CPDR: 40.0000 mg | DELAYED_RELEASE_CAPSULE | Freq: Every day | ORAL | 3 refills | Status: DC

## 2019-12-15 NOTE — Progress Notes (Signed)
Office Note 12/15/2019  CC:  Chief Complaint  Patient presents with  . Establish Care    Previous PCP, Dr.Alexander. Would like CPE  also f/u DM 2 and HLD.  HPI:  David Pearson is a 69 y.o. White male who is here for annual health maintenance exam. Patient's most recent primary MD: see above.  "I didn't like her much". Old records in EPIC/[facility] EMR were reviewed prior to or during today's visit.  Says he has another abd wall hernia and wants it repaired by same surgeon as last time but he doesn't recall name.  Occ causes him some discomfort.  DM/prediabetes: pt says labs have been borderline. Has been taking metformin for about 8 yrs.  HLD: simvastatin tolerated,   Has new prob: L thumb locks up, has to pop it through, feels pain on flexor tendon area, onset a week ago of having pain but prior to that had not been bothering him.  No hx of trauma/injury.  No formal exercise.  Plays some golf.  Lots of yardwork.  Occ goes to the GYM. He eats whatever he wants.  Past Medical History:  Diagnosis Date  . 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 Diabetes mellitus without complication ([place])   . Diverticulosis    on colonoscopy 2020  . ED (erectile dysfunction)   . GERD (gastroesophageal reflux disease)   . Hx of adenomatous polyp of colon 2020   sessile serrated polyp x 1->recall 5 yrs (Dr. Havery Moros)  . Hyperlipidemia   . Osteoarthritis of left knee    Hx of several steroid injections and these helped, meloxicam prn as of 12/2019    Past Surgical History:  Procedure Laterality Date  . ABDOMINAL HERNIA REPAIR  03/12/2018   abd wall hernia repair x 2  . CHOLECYSTECTOMY N/A 03/12/2018   Procedure: LAPAROSCOPIC CHOLECYSTECTOMY;  Surgeon: Rolm Bookbinder, MD;  Location: [facility];  Service: General;  Laterality: N/A;  . COLONOSCOPY  2019   sessile serrated polyp x 1, recall 2025  . HERNIA REPAIR     3  inguinal hernias  . TONSILLECTOMY     with adenoids    Family History  Problem Relation Age of Onset  . Breast cancer Maternal Aunt   . Cancer Father   . Muscular dystrophy Father   . COPD Mother   . COPD Sister   . Colon cancer Neg Hx   . Stomach cancer Neg Hx   . Rectal cancer Neg Hx   . Esophageal cancer Neg Hx     Social History   Socioeconomic History  . Marital status: Married    Spouse name: Not on file  . Number of children: Not on file  . Years of education: Not on file  . Highest education level: Not on file  Occupational History  . Not on file  Tobacco Use  . Smoking status: Former Smoker    Years: 5.00    Types: Cigarettes    Quit date: 2014    Years since quitting: 7.5  . Smokeless tobacco: Never Used  Vaping Use  . Vaping Use: Never used  Substance and Sexual Activity  . Alcohol use: Yes    Alcohol/week: 4.0 standard drinks    Types: 4 Standard drinks or equivalent per week  . Drug use: Not Currently  . Sexual activity: Yes  Other Topics Concern  . Not on file  Social History Narrative  Married, one daughter and one son.   Occup: Lobbyist, retired 2021.   Tobacco:quit approx 2016, 25 pack yr hx.   Alc: social         Social Determinants of Health   Financial Resource Strain:   . Difficulty of Paying Living Expenses:   Food Insecurity:   . Worried About Charity fundraiser in the Last Year:   . Arboriculturist in the Last Year:   Transportation Needs:   . Film/video editor (Medical):   Marland Kitchen Lack of Transportation (Non-Medical):   Physical Activity:   . Days of Exercise per Week:   . Minutes of Exercise per Session:   Stress:   . Feeling of Stress :   Social Connections:   . Frequency of Communication with Friends and Family:   . Frequency of Social Gatherings with Friends and Family:   . Attends Religious Services:   . Active Member of Clubs or Organizations:   . Attends Archivist Meetings:   Marland Kitchen Marital Status:    Intimate Partner Violence:   . Fear of Current or Ex-Partner:   . Emotionally Abused:   Marland Kitchen Physically Abused:   . Sexually Abused:     Outpatient Encounter Medications as of 12/15/2019  Medication Sig  . cholecalciferol (VITAMIN D) 1000 units tablet Take 1,000 Units by mouth daily.  . clobetasol cream (TEMOVATE) 7.74 % Apply 1 application topically as needed.  . metFORMIN (GLUCOPHAGE) 1000 MG tablet TAKE ONE TABLET BY MOUTH TWICE A DAY WITH A MEAL  . Multiple Vitamin (MULTIVITAMIN WITH MINERALS) TABS tablet Take 1 tablet by mouth daily.  Marland Kitchen omeprazole (PRILOSEC) 40 MG capsule Take 1 capsule (40 mg total) by mouth daily.  . sildenafil (VIAGRA) 100 MG tablet Take 0.5-1 tablets (50-100 mg total) by mouth as needed for erectile dysfunction (for use prior to sexual activity).  . simvastatin (ZOCOR) 40 MG tablet Take 1 tablet (40 mg total) by mouth daily.  . TRIAMCINOLONE ACETONIDE EX Apply topically 3 (three) times daily.  . vitamin E 100 UNIT capsule Take 450 Units by mouth daily.   . [DISCONTINUED] metFORMIN (GLUCOPHAGE) 1000 MG tablet TAKE ONE TABLET BY MOUTH TWICE A DAY WITH A MEAL  . [DISCONTINUED] omeprazole (PRILOSEC) 40 MG capsule TAKE ONE CAPSULE BY MOUTH DAILY  . [DISCONTINUED] simvastatin (ZOCOR) 40 MG tablet TAKE ONE TABLET BY MOUTH DAILY  . ferrous sulfate 325 (65 FE) MG EC tablet Take 1 tablet (325 mg total) by mouth daily with breakfast. No refills. Overdue for a visit. (Patient not taking: Reported on 12/15/2019)   No facility-administered encounter medications on file as of 12/15/2019.    No Known Allergies  ROS Review of Systems  Constitutional: Negative for appetite change, chills, fatigue and fever.  HENT: Negative for congestion, dental problem, ear pain and sore throat.   Eyes: Negative for discharge, redness and visual disturbance.  Respiratory: Negative for cough, chest tightness, shortness of breath and wheezing.   Cardiovascular: Negative for chest pain,  palpitations and leg swelling.  Gastrointestinal: Negative for abdominal pain, blood in stool, diarrhea, nausea and vomiting.  Genitourinary: Negative for difficulty urinating, dysuria, flank pain, frequency, hematuria and urgency.  Musculoskeletal: Negative for arthralgias, back pain, joint swelling, myalgias and neck stiffness.  Skin: Negative for pallor and rash.  Neurological: Negative for dizziness, speech difficulty, weakness and headaches.  Hematological: Negative for adenopathy. Does not bruise/bleed easily.  Psychiatric/Behavioral: Negative for confusion and sleep disturbance. The patient is not  nervous/anxious.     PE; Blood pressure 123/78, pulse 63, temperature 98 F (36.7 C), temperature source Oral, resp. rate 16, height 5\' 10"  (1.778 m), weight 243 lb 12.8 oz (110.6 kg), SpO2 95 %. Body mass index is 34.98 kg/m.  Gen: Alert, well appearing.  Patient is oriented to person, place, time, and situation. AFFECT: pleasant, lucid thought and speech. ENT: Ears: EACs clear, normal epithelium.  TMs with good light reflex and landmarks bilaterally.  Eyes: no injection, icteris, swelling, or exudate.  EOMI, PERRLA. Nose: no drainage or turbinate edema/swelling.  No injection or focal lesion.  Mouth: lips without lesion/swelling.  Oral mucosa pink and moist.  Dentition intact and without obvious caries or gingival swelling.  Oropharynx without erythema, exudate, or swelling.  Neck: supple/nontender.  No LAD, mass, or TM.  Carotid pulses 2+ bilaterally, without bruits. CV: RRR, no m/r/g.   LUNGS: CTA bilat, nonlabored resps, good aeration in all lung fields. ABD: soft, NT, ND, BS normal.  No hepatospenomegaly or mass.  No bruits. 3-4 cm umbilical hernia reducible and w/out tenderness. EXT: no clubbing, cyanosis, or edema.  Musculoskeletal: no joint swelling, erythema, warmth, or tenderness.  ROM of all joints intact.  L thumb w/out tenderness or erythema or swelling.  No nodule.  Pain in  flexor tendon area particularly over MTP jt with full flexion.  No locking/triggering. Skin - no sores or suspicious lesions or rashes or color changes Rectal exam: negative without mass, lesions or tenderness, PROSTATE EXAM: smooth and symmetric without nodules or tenderness.   Pertinent labs:  No results found for: TSH Lab Results  Component Value Date   WBC 6.9 11/04/2018   HGB 12.6 (L) 11/04/2018   HCT 37.9 (L) 11/04/2018   MCV 78.8 (L) 11/04/2018   PLT 254 11/04/2018   Lab Results  Component Value Date   IRON 101 11/04/2018   TIBC 474 (H) 11/04/2018   FERRITIN 10 (L) 11/04/2018    Lab Results  Component Value Date   CREATININE 0.94 11/04/2018   BUN 21 11/04/2018   NA 141 11/04/2018   K 4.9 11/04/2018   CL 102 11/04/2018   CO2 29 11/04/2018   Lab Results  Component Value Date   ALT 16 11/04/2018   AST 16 11/04/2018   ALKPHOS 59 03/13/2018   BILITOT 0.5 11/04/2018   Lab Results  Component Value Date   CHOL 174 11/04/2018   Lab Results  Component Value Date   HDL 35 (L) 11/04/2018   Lab Results  Component Value Date   LDLCALC 101 (H) 11/04/2018   Lab Results  Component Value Date   TRIG 268 (H) 11/04/2018   Lab Results  Component Value Date   CHOLHDL 5.0 (H) 11/04/2018   Lab Results  Component Value Date   HGBA1C 6.3 (H) 11/04/2018   ASSESSMENT AND PLAN:   Hx of IDA--has been on iron tab qd for 1 yr. Hemoccults x 3 ordered, repeat CBC with iron panel.  Continue iron tab for now.  Left thumb stenosing tenosynovitis, catching/triggering-->refer to Dr. Amedeo Plenty, hand specialist, today.  DM/prediabetes: Hba1c today. Says he's UTD on eye exam. We'll do urine microalb/cr and feet exam at next f/u visit in 6 mo.  HLD: simvastatin for "years". FLP and hepatic panel today.  Umbilical hernia: hx of repair in the past but it has recurred and bothers him some and he requests referral to Dr. Donne Hazel.  Pt states Dr. Donne Hazel did his umbil hernia repair  and cholecystectomy in  2019.  Referral ordered today.  Health maintenance exam: Reviewed age and gender appropriate health maintenance issues (prudent diet, regular exercise, health risks of tobacco and excessive alcohol, use of seatbelts, fire alarms in home, use of sunscreen).  Also reviewed age and gender appropriate health screening as well as vaccine recommendations. Vaccines: Tdap given today.  He declined pneumovax.  He has had covid vaccine but doesn't recall the dates. Labs: CMET, CBC, FLP, iron panel (hx of IDA), PSA, HbA1c. Prostate ca screening: DRE normal, PSA drawn today. Colon ca screening: UTD, recall 2025.   An After Visit Summary was printed and given to the patient.  Return in about 6 months (around 06/16/2020) for routine chronic illness f/u.  Signed:  Crissie Sickles, MD           12/15/2019

## 2019-12-15 NOTE — Patient Instructions (Signed)

## 2019-12-16 LAB — IRON,TIBC AND FERRITIN PANEL
%SAT: 26 % (calc) (ref 20–48)
Ferritin: 37 ng/mL (ref 24–380)
Iron: 102 ug/dL (ref 50–180)
TIBC: 392 mcg/dL (calc) (ref 250–425)

## 2019-12-17 ENCOUNTER — Other Ambulatory Visit: Payer: Self-pay

## 2019-12-17 DIAGNOSIS — E7800 Pure hypercholesterolemia, unspecified: Secondary | ICD-10-CM

## 2019-12-17 MED ORDER — ATORVASTATIN CALCIUM 40 MG PO TABS
40.0000 mg | ORAL_TABLET | Freq: Every day | ORAL | 2 refills | Status: DC
Start: 2019-12-17 — End: 2020-01-12

## 2019-12-22 ENCOUNTER — Encounter: Payer: Self-pay | Admitting: Family Medicine

## 2019-12-22 ENCOUNTER — Other Ambulatory Visit: Payer: Managed Care, Other (non HMO)

## 2019-12-22 DIAGNOSIS — Z862 Personal history of diseases of the blood and blood-forming organs and certain disorders involving the immune mechanism: Secondary | ICD-10-CM

## 2019-12-22 LAB — HEMOCCULT SLIDES (X 3 CARDS)
Fecal Occult Blood: POSITIVE — AB
OCCULT 1: NEGATIVE
OCCULT 2: NEGATIVE
OCCULT 3: NEGATIVE
OCCULT 4: NEGATIVE
OCCULT 5: POSITIVE — AB

## 2019-12-23 ENCOUNTER — Other Ambulatory Visit: Payer: Self-pay | Admitting: Family Medicine

## 2019-12-23 DIAGNOSIS — R195 Other fecal abnormalities: Secondary | ICD-10-CM

## 2019-12-23 DIAGNOSIS — Z862 Personal history of diseases of the blood and blood-forming organs and certain disorders involving the immune mechanism: Secondary | ICD-10-CM

## 2019-12-24 ENCOUNTER — Encounter: Payer: Self-pay | Admitting: Gastroenterology

## 2020-01-12 ENCOUNTER — Other Ambulatory Visit: Payer: Self-pay

## 2020-01-12 ENCOUNTER — Telehealth: Payer: Self-pay

## 2020-01-12 MED ORDER — ATORVASTATIN CALCIUM 40 MG PO TABS: 40.0000 mg | ORAL_TABLET | Freq: Every day | ORAL | 0 refills | Status: DC

## 2020-01-12 MED ORDER — ATORVASTATIN CALCIUM 40 MG PO TABS: 40.0000 mg | ORAL_TABLET | Freq: Every day | ORAL | Status: DC

## 2020-01-12 MED ORDER — METAXALONE 800 MG PO TABS
800.0000 mg | ORAL_TABLET | Freq: Three times a day (TID) | ORAL | 0 refills | Status: DC | PRN
Start: 2020-01-12 — End: 2020-06-21

## 2020-01-12 NOTE — Telephone Encounter (Signed)
Spoke with patient and he just wants a qty of 60 tabs sent until lab appt so he does not have to drive to the pharmacy so often. Rx faxed in. Regarding pulled muscle, he has tried otc voltaren gel but not helping and requesting Rx for skelaxin. This would be new Rx for him from you.  Please advise, thanks.

## 2020-01-12 NOTE — Telephone Encounter (Signed)
Patient requests atorvastatin (LIPITOR) 90 day supply.  Patient has pulled a back muscle in his back. He is requesting an Rx for Skelaxin.

## 2020-01-12 NOTE — Telephone Encounter (Signed)
OK atorvastatin and skelaxin eRx'd

## 2020-01-12 NOTE — Telephone Encounter (Signed)
Left detailed message advising meds sent, okay per dpr.

## 2020-01-27 ENCOUNTER — Encounter: Payer: Self-pay | Admitting: Osteopathic Medicine

## 2020-02-22 ENCOUNTER — Ambulatory Visit (INDEPENDENT_AMBULATORY_CARE_PROVIDER_SITE_OTHER): Payer: Managed Care, Other (non HMO) | Admitting: Gastroenterology

## 2020-02-22 ENCOUNTER — Encounter: Payer: Self-pay | Admitting: Gastroenterology

## 2020-02-22 VITALS — BP 136/78 | HR 79 | Ht 70.0 in | Wt 241.4 lb

## 2020-02-22 DIAGNOSIS — D509 Iron deficiency anemia, unspecified: Secondary | ICD-10-CM | POA: Diagnosis not present

## 2020-02-22 DIAGNOSIS — Z86010 Personal history of colonic polyps: Secondary | ICD-10-CM

## 2020-02-22 DIAGNOSIS — K219 Gastro-esophageal reflux disease without esophagitis: Secondary | ICD-10-CM

## 2020-02-22 DIAGNOSIS — R195 Other fecal abnormalities: Secondary | ICD-10-CM | POA: Diagnosis not present

## 2020-02-22 NOTE — Progress Notes (Signed)
HPI :  69 year old male with a history of colon polyps, GERD, arthritis, here for a follow-up visit regarding heme positive stool and anemia.  His last colonoscopy with me was in January 2020, he had a 1 cm sessile serrated polyp removed at that time along with diverticulosis and internal hemorrhoids.  In reviewing labs after that time, he was diagnosed with iron deficiency in June 2020, hemoglobin of 12.6 at that time an MCV of 78.8, with a ferritin of 10.  He states he was placed on iron at that time and this resolved his anemia and iron studies improved.  Recently he had Hemoccult sent and 1 of 5 samples was positive for occult blood on 12/22/19  He states he has normal brown stools does not see any overt blood in the stool or on the toilet paper.  He does feel as though it is quite hot out and he exerts himself sometimes he can have some leakage of blood in his underwear he thinks perhaps from a hemorrhoid.  Otherwise does not have any pain or rectal irritation.  No abdominal pains.  His bowels are otherwise fairly normal.  He denies any routine use of NSAIDs now but previously took Aleve as needed.  He stopped taking NSAIDs.  He has had reflux symptoms for several years, he states he has been on omeprazole 40 mg a day for upwards of 15 years.  He states this works pretty well to control his reflux symptoms.  No dysphagia, no abdominal pains.  He denies any family history of esophagus cancer.  He is never had a prior EGD screening for Barrett's.  His preparation was adequate and his last colonoscopy just last year.  We discussed options of how he wanted to proceed as below.   Colonoscopy 06/12/18 - The perianal and digital rectal examinations were normal. - Multiple medium-mouthed diverticula were found in the transverse colon, hepatic flexure and left colon. - A 10 mm polyp was found in the hepatic flexure. The polyp was sessile. The polyp was removed with a cold snare. Resection and retrieval  were complete. - A 4 mm polyp was found in the hepatic flexure. The polyp was flat and snare would not grasp it. The polyp was removed with a cold biopsy forceps. Resection and retrieval were complete. - A 3 mm polyp was found in the descending colon. The polyp was sessile. The polyp was removed with a cold biopsy forceps. Resection and retrieval were complete. - Internal hemorrhoids were found during retroflexion. - The exam was otherwise without abnormality.  Diagnosis Surgical [P], hepatic flexure, descending, polyp (3) - SESSILE SERRATED POLYP (MULTIPLE FRAGMENTS). - HYPERPLASTIC POLYP (X2 FRAGMENTS). - NO DYSPLASIA OR MALIGNANCY.     Past Medical History:  Diagnosis Date  . 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 Diabetes mellitus without complication ([place])   . Diverticulosis    on colonoscopy 2020  . ED (erectile dysfunction)   . GERD (gastroesophageal reflux disease)   . History of iron deficiency anemia    2020.  PO iron x 1 yr.  Hemoccults 1/3 pos 12/2019 (when Hb and iron back to normal)->recommended GI ref  . Hx of adenomatous polyp of colon 2020   sessile serrated polyp x 1->recall 5 yrs (Dr. Havery Moros)  . Hyperlipidemia   . Osteoarthritis of left knee    Hx of several steroid injections and these helped, meloxicam prn as of  12/2019     Past Surgical History:  Procedure Laterality Date  . ABDOMINAL HERNIA REPAIR  03/12/2018   abd wall hernia repair x 2  . CHOLECYSTECTOMY N/A 03/12/2018   Procedure: LAPAROSCOPIC CHOLECYSTECTOMY;  Surgeon: Rolm Bookbinder, MD;  Location: [facility];  Service: General;  Laterality: N/A;  . COLONOSCOPY  2019   sessile serrated polyp x 1, recall 2025  . HERNIA REPAIR     3 inguinal hernias  . TONSILLECTOMY     with adenoids   Family History  Problem Relation Age of Onset  . Breast cancer Maternal Aunt   . Cancer Father   . Muscular dystrophy Father   . COPD  Mother   . COPD Sister   . Colon cancer Neg Hx   . Stomach cancer Neg Hx   . Rectal cancer Neg Hx   . Esophageal cancer Neg Hx    Social History   Tobacco Use  . Smoking status: Former Smoker    Years: 5.00    Types: Cigarettes    Quit date: 2014    Years since quitting: 7.7  . Smokeless tobacco: Never Used  Vaping Use  . Vaping Use: Never used  Substance Use Topics  . Alcohol use: Yes    Alcohol/week: 4.0 standard drinks    Types: 4 Standard drinks or equivalent per week  . Drug use: Not Currently   Current Outpatient Medications  Medication Sig Dispense Refill  . atorvastatin (LIPITOR) 40 MG tablet Take 1 tablet (40 mg total) by mouth daily. 60 tablet 0  . cholecalciferol (VITAMIN D) 1000 units tablet Take 1,000 Units by mouth daily.    . clobetasol cream (TEMOVATE) 9.21 % Apply 1 application topically as needed.    . ferrous sulfate 325 (65 FE) MG EC tablet Take 1 tablet (325 mg total) by mouth daily with breakfast. No refills. Overdue for a visit. 90 tablet 0  . metaxalone (SKELAXIN) 800 MG tablet Take 1 tablet (800 mg total) by mouth 3 (three) times daily as needed for muscle spasms. 30 tablet 0  . metFORMIN (GLUCOPHAGE) 1000 MG tablet TAKE ONE TABLET BY MOUTH TWICE A DAY WITH A MEAL 180 tablet 3  . Multiple Vitamin (MULTIVITAMIN WITH MINERALS) TABS tablet Take 1 tablet by mouth daily.    Marland Kitchen omeprazole (PRILOSEC) 40 MG capsule Take 1 capsule (40 mg total) by mouth daily. 90 capsule 3  . sildenafil (VIAGRA) 100 MG tablet Take 0.5-1 tablets (50-100 mg total) by mouth as needed for erectile dysfunction (for use prior to sexual activity). 30 tablet 3  . TRIAMCINOLONE ACETONIDE EX Apply topically 3 (three) times daily.    . vitamin E 100 UNIT capsule Take 450 Units by mouth daily.      No current facility-administered medications for this visit.   No Known Allergies   Review of Systems: All systems reviewed and negative except where noted in HPI.   Lab Results  Component  Value Date   WBC 6.9 12/15/2019   HGB 13.9 12/15/2019   HCT 40.5 12/15/2019   MCV 85.5 12/15/2019   PLT 200.0 12/15/2019    Lab Results  Component Value Date   CREATININE 0.87 12/15/2019   BUN 25 (H) 12/15/2019   NA 139 12/15/2019   K 5.0 12/15/2019   CL 101 12/15/2019   CO2 31 12/15/2019    Lab Results  Component Value Date   ALT 28 12/15/2019   AST 22 12/15/2019   ALKPHOS 70 12/15/2019   BILITOT  0.5 12/15/2019     Physical Exam: BP 136/78   Pulse 79   Ht 5\' 10"  (1.778 m)   Wt 241 lb 6 oz (109.5 kg)   BMI 34.63 kg/m  Constitutional: Pleasant,well-developed, male in no acute distress. HEENT: Normocephalic and atraumatic. Conjunctivae are normal. No scleral icterus. Neck supple.  Cardiovascular: Normal rate, regular rhythm.  Pulmonary/chest: Effort normal and breath sounds normal. No wheezing, rales or rhonchi. Abdominal: Soft, nondistended, nontender.  There are no masses palpable. DRE / anoscopy - no fissure, internal hemorrhoids noted, no mass lesion Extremities: no edema Lymphadenopathy: No cervical adenopathy noted. Neurological: Alert and oriented to person place and time. Skin: Skin is warm and dry. No rashes noted. Psychiatric: Normal mood and affect. Behavior is normal.   ASSESSMENT AND PLAN: 69 year old male here for reassessment of the following:  Iron deficiency anemia / heme positive stools - on review of anemia this actually appears to have started just before his last colonoscopy, although not formally diagnosed with iron deficiency until a few months later.  He did not have any high risk lesions on his colonoscopy, no cause for anemia on that exam at the time in 2020.  He has been treated for iron deficiency with iron supplementation and his anemia has resolved.  He has hemorrhoids on colonoscopy and has rare scant bleeding likely from these based off anoscopy today, perhaps causing his heme positive stool.  We discussed options moving forward.  As  below given his history of reflux he is due for Barrett's screening regardless, in light of history of iron deficiency I do recommend EGD to clear his upper tract.  I discussed risk benefits of this and anesthesia and he wanted to proceed.  We discussed whether or not he wanted to do a colonoscopy for this issue.  Given his last exam was relatively recent with a good prep and in the setting of this anemia without any significant findings, I do not feel strongly that we need to do this again right now although he understands the very small risk of an interval lesion without looking.  He is comfortable holding off on colonoscopy for now and just doing endoscopy which I think is very reasonable.  Further recommendations pending his course and the results.  GERD - longstanding reflux, given his age, gender, ethnicity, weight, he meets criteria for screening endoscopy for Barrett's esophagus.  This will also evaluate history of iron deficiency.  As above he wishes to proceed.  History of colon polyps - due for surveillance in January 2023 but pending his course can consider it sooner if anemia recurs without findings on EGD  [place] Cellar, MD [facility]  CC: McGowen, Adrian Blackwater, MD

## 2020-02-22 NOTE — Patient Instructions (Addendum)
If you are age 68 or older, your body mass index should be between 23-30. Your Body mass index is 34.63 kg/m. If this is out of the aforementioned range listed, please consider follow up with your Primary Care Provider.  If you are age 65 or younger, your body mass index should be between 19-25. Your Body mass index is 34.63 kg/m. If this is out of the aformentioned range listed, please consider follow up with your Primary Care Provider.   You have been scheduled for an endoscopy. Please follow written instructions given to you at your visit today. If you use inhalers (even only as needed), please bring them with you on the day of your procedure.   Please avoid all NSAIDs.   Thank you for entrusting me with your care and for choosing [facility], Dr. Loyalton Cellar

## 2020-02-23 ENCOUNTER — Encounter: Payer: Self-pay | Admitting: Family Medicine

## 2020-03-20 ENCOUNTER — Other Ambulatory Visit: Payer: Self-pay

## 2020-03-20 ENCOUNTER — Ambulatory Visit (INDEPENDENT_AMBULATORY_CARE_PROVIDER_SITE_OTHER): Payer: Managed Care, Other (non HMO)

## 2020-03-20 DIAGNOSIS — E7800 Pure hypercholesterolemia, unspecified: Secondary | ICD-10-CM | POA: Diagnosis not present

## 2020-03-20 LAB — LIPID PANEL
Cholesterol: 139 mg/dL (ref 0–200)
HDL: 39.3 mg/dL (ref >39.00)
LDL Cholesterol: 76 mg/dL (ref 0–99)
NonHDL: 99.4
Total CHOL/HDL Ratio: 4
Triglycerides: 118 mg/dL (ref 0.0–149.0)
VLDL: 23.6 mg/dL (ref 0.0–40.0)

## 2020-03-20 LAB — HEPATIC FUNCTION PANEL
ALT: 25 U/L (ref 0–53)
AST: 18 U/L (ref 0–37)
Albumin: 4.2 g/dL (ref 3.5–5.2)
Alkaline Phosphatase: 92 U/L (ref 39–117)
Bilirubin, Direct: 0.1 mg/dL (ref 0.0–0.3)
Total Bilirubin: 0.5 mg/dL (ref 0.2–1.2)
Total Protein: 6.4 g/dL (ref 6.0–8.3)

## 2020-03-20 MED ORDER — CLOBETASOL PROPIONATE 0.05 % EX CREA: 1.0000 "application " | TOPICAL_CREAM | CUTANEOUS | 1 refills | Status: DC | PRN

## 2020-03-20 MED ORDER — ATORVASTATIN CALCIUM 40 MG PO TABS: 40.0000 mg | ORAL_TABLET | Freq: Every day | ORAL | 3 refills | Status: DC

## 2020-03-20 NOTE — Progress Notes (Signed)
LVM for CB ( lab results and Rx)

## 2020-04-07 ENCOUNTER — Other Ambulatory Visit: Payer: Self-pay | Admitting: General Surgery

## 2020-04-17 ENCOUNTER — Encounter: Payer: Self-pay | Admitting: Family Medicine

## 2020-04-18 ENCOUNTER — Encounter: Payer: Self-pay | Admitting: Gastroenterology

## 2020-04-20 ENCOUNTER — Other Ambulatory Visit: Payer: Self-pay

## 2020-04-20 ENCOUNTER — Ambulatory Visit (AMBULATORY_SURGERY_CENTER): Payer: Managed Care, Other (non HMO) | Admitting: Gastroenterology

## 2020-04-20 ENCOUNTER — Encounter: Payer: Self-pay | Admitting: Gastroenterology

## 2020-04-20 VITALS — BP 116/71 | HR 66 | Temp 97.3°F | Resp 17 | Ht 70.0 in | Wt 241.0 lb

## 2020-04-20 DIAGNOSIS — K317 Polyp of stomach and duodenum: Secondary | ICD-10-CM | POA: Diagnosis not present

## 2020-04-20 DIAGNOSIS — K449 Diaphragmatic hernia without obstruction or gangrene: Secondary | ICD-10-CM | POA: Diagnosis not present

## 2020-04-20 DIAGNOSIS — K219 Gastro-esophageal reflux disease without esophagitis: Secondary | ICD-10-CM | POA: Diagnosis not present

## 2020-04-20 DIAGNOSIS — K319 Disease of stomach and duodenum, unspecified: Secondary | ICD-10-CM

## 2020-04-20 DIAGNOSIS — D509 Iron deficiency anemia, unspecified: Secondary | ICD-10-CM

## 2020-04-20 DIAGNOSIS — K2980 Duodenitis without bleeding: Secondary | ICD-10-CM | POA: Diagnosis not present

## 2020-04-20 MED ORDER — SODIUM CHLORIDE 0.9 % IV SOLN: 500.0000 mL | Freq: Once | INTRAVENOUS | Status: DC

## 2020-04-20 NOTE — Progress Notes (Signed)
Called to room to assist during endoscopic procedure.  Patient ID and intended procedure confirmed with present staff. Received instructions for my participation in the procedure from the performing physician.  

## 2020-04-20 NOTE — Patient Instructions (Signed)
HANDOUTS PROVIDED ON: Hiatal Hernia  The biopsies taken today have been sent for pathology.  The results can take 1-3 weeks to receive.      You may resume your previous diet and medication schedule.  Thank you for allowing [place] to care for you today!!!   YOU HAD AN ENDOSCOPIC PROCEDURE TODAY AT [facility]:   Refer to the procedure report that was given to you for any specific questions about what was found during the examination.  If the procedure report does not answer your questions, please call your gastroenterologist to clarify.  If you requested that your care partner not be given the details of your procedure findings, then the procedure report has been included in a sealed envelope for you to review at your convenience later.  YOU SHOULD EXPECT: Some feelings of bloating in the abdomen. Passage of more gas than usual.  Walking can help get rid of the air that was put into your GI tract during the procedure and reduce the bloating.  Please Note:  You might notice some irritation and congestion in your nose or some drainage.  This is from the oxygen used during your procedure.  There is no need for concern and it should clear up in a day or so.  SYMPTOMS TO REPORT IMMEDIATELY:   Following upper endoscopy (EGD)  Vomiting of blood or coffee ground material  New chest pain or pain under the shoulder blades  Painful or persistently difficult swallowing  New shortness of breath  Fever of 100F or higher  Black, tarry-looking stools  For urgent or emergent issues, a gastroenterologist can be reached at any hour by calling [phone]. Do not use MyChart messaging for urgent concerns.    DIET:  We do recommend a small meal at first, but then you may proceed to your regular diet.  Drink plenty of fluids but you should avoid alcoholic beverages for 24 hours.  ACTIVITY:  You should plan to take it easy for the rest of today and you should NOT DRIVE or use heavy  machinery until tomorrow (because of the sedation medicines used during the test).    FOLLOW UP: Our staff will call the number listed on your records 48-72 hours following your procedure to check on you and address any questions or concerns that you may have regarding the information given to you following your procedure. If we do not reach you, we will leave a message.  We will attempt to reach you two times.  During this call, we will ask if you have developed any symptoms of COVID 19. If you develop any symptoms (ie: fever, flu-like symptoms, shortness of breath, cough etc.) before then, please call [phone].  If you test positive for Covid 19 in the 2 weeks post procedure, please call and report this information to [place].    If any biopsies were taken you will be contacted by phone or by letter within the next 1-3 weeks.  Please call us at [phone] if you have not heard about the biopsies in 3 weeks.    SIGNATURES/CONFIDENTIALITY: You and/or your care partner have signed paperwork which will be entered into your electronic medical record.  These signatures attest to the fact that that the information above on your After Visit Summary has been reviewed and is understood.  Full responsibility of the confidentiality of this discharge information lies with you and/or your care-partner.

## 2020-04-20 NOTE — Progress Notes (Signed)
Vitals by CW 

## 2020-04-20 NOTE — Progress Notes (Signed)
A and O x3. Report to RN. Tolerated MAC anesthesia well.Teeth unchanged after procedure.

## 2020-04-20 NOTE — Op Note (Signed)
[facility] Patient Name: David Pearson Procedure Date: 04/20/2020 8:24 AM MRN: [id number] Endoscopist: Remo Lipps P. Havery Moros , MD Age: 69 Referring MD:  Date of Birth: 1950/09/19 Gender: Male Account #: [id number] Procedure:                Upper GI endoscopy Indications:              Iron deficiency anemia, no cause for this on                            colonoscopy last year. GERD, screening for                            Barrett's. Medicines:                Monitored Anesthesia Care Procedure:                Pre-Anesthesia Assessment:                           - Prior to the procedure, a History and Physical                            was performed, and patient medications and                            allergies were reviewed. The patient's tolerance of                            previous anesthesia was also reviewed. The risks                            and benefits of the procedure and the sedation                            options and risks were discussed with the patient.                            All questions were answered, and informed consent                            was obtained. Prior Anticoagulants: The patient has                            taken no previous anticoagulant or antiplatelet                            agents. ASA Grade Assessment: III - A patient with                            severe systemic disease. After reviewing the risks                            and benefits, the patient was deemed in  satisfactory condition to undergo the procedure.                           After obtaining informed consent, the endoscope was                            passed under direct vision. Throughout the                            procedure, the patient's blood pressure, pulse, and                            oxygen saturations were monitored continuously. The                            Endoscope was introduced through the mouth, and                             advanced to the second part of duodenum. The upper                            GI endoscopy was accomplished without difficulty.                            The patient tolerated the procedure well. Scope In: Scope Out: Findings:                 Esophagogastric landmarks were identified: the                            Z-line was found at 40 cm, the gastroesophageal                            junction was found at 40 cm and the upper extent of                            the gastric folds was found at 41 cm from the                            incisors.                           A 1 cm hiatal hernia was present.                           The exam of the esophagus was otherwise normal.                           A single large pedunculated polyp with a thick                            stalk with stigmata of recent bleeding (ulceration)  was found in the gastric antrum. Given the size of                            the stalk this lesion is at higher risk for                            bleeding with polypectomy. Biopsies were taken with                            a cold forceps for histology initially. The lesion                            was quite friable and oozed for several minutes                            before stopping, it was observed for several                            minutes. Will discuss with patient and plan to                            remove this at the hospital given bleeding risk.                           Multiple benign appearing small sessile polyps were                            found in the gastric antrum and in the prepyloric                            region of the stomach. Biopsies were taken with a                            cold forceps for histology.                           The exam of the stomach was otherwise normal.                           Biopsies were taken with a cold forceps in the                             gastric body, at the incisura and in the gastric                            antrum for Helicobacter pylori testing.                           A single 3 mm sessile polyp was found in the                            duodenal bulb. The polyp was removed with a cold  biopsy forceps. Resection and retrieval were                            complete.                           The exam of the duodenum was otherwise normal. Complications:            No immediate complications. Estimated blood loss:                            Minimal. Estimated Blood Loss:     Estimated blood loss was minimal. Impression:               - Esophagogastric landmarks identified.                           - 1 cm hiatal hernia.                           - Normal esophagus otherwise. No evidence of                            Barrett's esophagus.                           - A large inflamed gastric polyp in the antrum,                            with a thick stalk, at risk for bleeding with                            polypectomy. Biopsied today, assess for dysplasia.                            Will plan for polypectomy at the hospital. .                           - Multiple benign appearing small gastric polyps in                            the antrum. Biopsied.                           - Normal stomach otherwise - biopsies taken to rule                            out H pylori                           - A single duodenal polyp. Resected and retrieved.                           - Normal duodenum otherwise. Recommendation:           - Patient has a contact number available for  emergencies. The signs and symptoms of potential                            delayed complications were discussed with the                            patient. Return to normal activities tomorrow.                            Written discharge instructions were provided to the                             patient.                           - Resume previous diet.                           - Continue present medications.                           - Await pathology results with further                            recommendations. Will plan for polypectomy of                            gastric polyp at the hospital given bleeding risk.                            This is the likely cause for iron deficiency. Remo Lipps P. Rajah Lamba, MD 04/20/2020 9:03:19 AM This report has been signed electronically.

## 2020-04-20 NOTE — Progress Notes (Signed)
Lidocaine  Buffer  robinol antisialgogue

## 2020-04-24 ENCOUNTER — Telehealth: Payer: Self-pay | Admitting: *Deleted

## 2020-04-24 NOTE — Telephone Encounter (Signed)
  Follow up Call-  Call back number 04/20/2020 06/12/2018  Post procedure Call Back phone  # [phone]  Permission to leave phone message Yes Yes     Patient questions:  Do you have a fever, pain , or abdominal swelling? No. Pain Score  0 *  Have you tolerated food without any problems? Yes.    Have you been able to return to your normal activities? Yes.    Do you have any questions about your discharge instructions: Diet   No. Medications  No. Follow up visit  No.  Do you have questions or concerns about your Care? No.  Actions: * If pain score is 4 or above: No action needed, pain <4.  1. Have you developed a fever since your procedure? no  2.   Have you had an respiratory symptoms (SOB or cough) since your procedure? no  3.   Have you tested positive for COVID 19 since your procedure no  4.   Have you had any family members/close contacts diagnosed with the COVID 19 since your procedure?  no   If yes to any of these questions please route to Joylene Rex, RN and Joella Prince, RN

## 2020-05-01 ENCOUNTER — Other Ambulatory Visit: Payer: Self-pay

## 2020-05-01 DIAGNOSIS — K317 Polyp of stomach and duodenum: Secondary | ICD-10-CM

## 2020-05-15 ENCOUNTER — Other Ambulatory Visit (HOSPITAL_COMMUNITY): Payer: Managed Care, Other (non HMO)

## 2020-05-15 ENCOUNTER — Other Ambulatory Visit (HOSPITAL_COMMUNITY)
Admission: RE | Admit: 2020-05-15 | Discharge: 2020-05-15 | Disposition: A | Discharge status: Home / Self Care | Payer: Managed Care, Other (non HMO) | Source: Ambulatory Visit | Attending: General Surgery | Admitting: General Surgery

## 2020-05-15 DIAGNOSIS — Z01812 Encounter for preprocedural laboratory examination: Secondary | ICD-10-CM | POA: Diagnosis not present

## 2020-05-15 DIAGNOSIS — Z20822 Contact with and (suspected) exposure to covid-19: Secondary | ICD-10-CM | POA: Insufficient documentation

## 2020-05-15 LAB — SARS CORONAVIRUS 2 (TAT 6-24 HRS): SARS Coronavirus 2: NEGATIVE

## 2020-05-17 ENCOUNTER — Telehealth: Payer: Self-pay | Admitting: Gastroenterology

## 2020-05-17 ENCOUNTER — Encounter (HOSPITAL_COMMUNITY): Payer: Self-pay | Admitting: General Surgery

## 2020-05-17 NOTE — Telephone Encounter (Signed)
The pt wanted to have his procedure moved up to this year.  He has been advised that there are no sooner appts.  He will keep the appt as scheduled.

## 2020-05-17 NOTE — Progress Notes (Signed)
Patient denies shortness of breath, fever, cough or chest pain.  PCP - Dr Anitra Lauth Cardiologist - n/a Gertie [facility] - Dr Havery Moros  Chest x-ray - n/a EKG - DOS 05/18/20 Stress Test - n/a ECHO - n/a Cardiac Cath - n/a  Sleep Study -  Yes CPAP - does not use cpap  Fasting Blood Sugar - unknown  Checks Blood Sugar 0   times a day BM 2 , no meds  ERAS: Clears til 0430 DOS.  Anesthesia review: Yes  STOP now taking any Aspirin (unless otherwise instructed by your surgeon), Aleve, Naproxen, Ibuprofen, Motrin, Advil, Goody's, BC's, all herbal medications, fish oil, and all vitamins.   Coronavirus Screening Covid  Test on 05/15/20 was negative.  Patient verbalized understanding of instructions that were given via phone.

## 2020-05-17 NOTE — Telephone Encounter (Signed)
Patient is calling to possibly reschedule his hospital procedure

## 2020-05-17 NOTE — Progress Notes (Signed)
Anesthesia Chart Review:  Case: [id number] Date/Time: 05/18/20 0715   Procedures:      INCISIONAL HERNIA REPAIR WITH MESH (N/A )     POSSIBLE LAPAROSCOPY DIAGNOSTIC (N/A )   Anesthesia type: General   Pre-op diagnosis: INCISIONAL HERNIA   Location: [facility] OR ROOM 02 / [facility] OR   Surgeons: Rolm Bookbinder, MD      DISCUSSION: Patient is a 69 year old male scheduled for the above procedure.  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  05/15/2020 presurgical COVID-19 test negative.  He is a same-day work-up, so he is for anesthesia team evaluation on the day of surgery and labs and EKG as indicated.   VS: Wt Readings from Last 3 Encounters:  04/20/20 109.3 kg  02/22/20 109.5 kg  12/15/19 110.6 kg   BP Readings from Last 3 Encounters:  04/20/20 116/71  02/22/20 136/78  12/15/19 123/78   Pulse Readings from Last 3 Encounters:  04/20/20 66  02/22/20 79  12/15/19 63    PROVIDERS: Tammi Sou, MD his PCP   LABS: For day of surgery. Currently, last results include: Lab Results  Component Value Date   WBC 6.9 12/15/2019   HGB 13.9 12/15/2019   HCT 40.5 12/15/2019   PLT 200.0 12/15/2019   GLUCOSE 108 (H) 12/15/2019   CHOL 139 03/20/2020   TRIG 118.0 03/20/2020   HDL 39.30 03/20/2020   LDLCALC 76 03/20/2020   ALT 25 03/20/2020   AST 18 03/20/2020   NA 139 12/15/2019   K 5.0 12/15/2019   CL 101 12/15/2019   CREATININE 0.87 12/15/2019   BUN 25 (H) 12/15/2019   CO2 31 12/15/2019   PSA 0.61 12/15/2019   HGBA1C 6.4 12/15/2019    EKG: Last available EKG 03/12/18 and showed ST at 103 bpm, possible inferior infarct (age undetermined).    CV: [place] AAA Screening 05/15/18: IMPRESSION: 1. Mild ectasia of the mid aspect of dominant aorta measuring 2.6 cm in diameter. Recommend follow-up aortic ultrasound in 5 years.  This recommendation follows ACR consensus guidelines: White Paper of the ACR Incidental Findings Committee II on Vascular Findings. J Am Coll Radiol 2013; 10:789-794. 2.  Aortic Atherosclerosis (ICD10-I70.0).   Past Medical History:  Diagnosis Date  . 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 Cataract   . Diabetes mellitus without complication ([place])   . Diverticulosis    on colonoscopy 2020  . ED (erectile dysfunction)   . GERD (gastroesophageal reflux disease)   . History of iron deficiency anemia    2020.  PO iron x 1 yr.  Hemoccults 1/3 pos 12/2019 (when Hb and iron back to normal)->recommended GI ref  . Hx of adenomatous polyp of colon 2020   sessile serrated polyp x 1->recall 5 yrs (Dr. Havery Moros)  . Hyperlipidemia   . Osteoarthritis of left knee    Hx of several steroid injections and these helped, meloxicam prn as of 12/2019  . Sleep apnea   . Trigger thumb of left hand 2021   inj x 2 by ortho    Past Surgical History:  Procedure Laterality Date  . ABDOMINAL HERNIA REPAIR  03/12/2018   abd wall hernia repair x 2  . ABDOMINOPLASTY    . cataracts    . CHOLECYSTECTOMY N/A 03/12/2018   Procedure: LAPAROSCOPIC CHOLECYSTECTOMY;  Surgeon: Rolm Bookbinder, MD;  Location:  [facility] OR;  Service: General;  Laterality: N/A;  . COLONOSCOPY  06/12/2018   sessile serrated polyp x 1, recall 2025  . HERNIA REPAIR     3 inguinal hernias  . TONSILLECTOMY     with adenoids    MEDICATIONS: No current facility-administered medications for this encounter.   Marland Kitchen atorvastatin (LIPITOR) 40 MG tablet  . cholecalciferol (VITAMIN D) 1000 units tablet  . clobetasol cream (TEMOVATE) 0.05 %  . ferrous sulfate 325 (65 FE) MG EC tablet  . metaxalone (SKELAXIN) 800 MG tablet  . metFORMIN (GLUCOPHAGE) 1000 MG tablet  . Multiple Vitamin (MULTIVITAMIN WITH MINERALS) TABS tablet  . omeprazole (PRILOSEC) 40 MG capsule  . sildenafil  (VIAGRA) 100 MG tablet  . TRIAMCINOLONE ACETONIDE EX  . vitamin E 100 UNIT capsule    Myra Gianotti, PA-C Surgical Short Stay/Anesthesiology [facility] Phone [phone] [facility] Phone [phone] 05/17/2020 1:44 PM

## 2020-05-17 NOTE — Anesthesia Preprocedure Evaluation (Addendum)
Anesthesia Evaluation  Patient identified by MRN, date of birth, ID band Patient awake    Reviewed: Allergy & Precautions, NPO status , Patient's Chart, lab work & pertinent test results  Airway Mallampati: I  TM Distance: >3 FB Neck ROM: Full    Dental no notable dental hx.    Pulmonary former smoker,    Pulmonary exam normal breath sounds clear to auscultation       Cardiovascular negative cardio ROS Normal cardiovascular exam Rhythm:Regular Rate:Normal     Neuro/Psych negative neurological ROS  negative psych ROS   GI/Hepatic Neg liver ROS, GERD  Medicated and Controlled,  Endo/Other    Renal/GU negative Renal ROS     Musculoskeletal  (+) Arthritis ,   Abdominal (+) + obese,   Peds  Hematology HLD   Anesthesia Other Findings INCISIONAL HERNIA  Reproductive/Obstetrics                            Anesthesia Physical Anesthesia Plan  ASA: II  Anesthesia Plan: General and Regional   Post-op Pain Management: GA combined w/ Regional for post-op pain   Induction: Intravenous  PONV Risk Score and Plan: 2 and Ondansetron, Dexamethasone, Midazolam and Treatment may vary due to age or medical condition  Airway Management Planned: Oral ETT  Additional Equipment:   Intra-op Plan:   Post-operative Plan: Extubation in OR  Informed Consent: I have reviewed the patients History and Physical, chart, labs and discussed the procedure including the risks, benefits and alternatives for the proposed anesthesia with the patient or authorized representative who has indicated his/her understanding and acceptance.     Dental advisory given  Plan Discussed with: CRNA  Anesthesia Plan Comments: (Reviewed PAT note written 05/17/2020 by Myra Gianotti, PA-C. )      Anesthesia Quick Evaluation

## 2020-05-18 ENCOUNTER — Encounter (HOSPITAL_COMMUNITY)
Admission: RE | Disposition: A | Discharge status: Home / Self Care | Payer: Self-pay | Source: Home / Self Care | Attending: General Surgery

## 2020-05-18 ENCOUNTER — Encounter (HOSPITAL_COMMUNITY): Payer: Self-pay | Admitting: General Surgery

## 2020-05-18 ENCOUNTER — Ambulatory Visit (HOSPITAL_COMMUNITY): Payer: Managed Care, Other (non HMO) | Admitting: Vascular Surgery

## 2020-05-18 ENCOUNTER — Other Ambulatory Visit: Payer: Self-pay

## 2020-05-18 ENCOUNTER — Ambulatory Visit (HOSPITAL_COMMUNITY)
Admission: RE | Admit: 2020-05-18 | Discharge: 2020-05-18 | Disposition: A | Discharge status: Home / Self Care | Payer: Managed Care, Other (non HMO) | Attending: General Surgery | Admitting: General Surgery

## 2020-05-18 DIAGNOSIS — Z87891 Personal history of nicotine dependence: Secondary | ICD-10-CM | POA: Diagnosis not present

## 2020-05-18 DIAGNOSIS — K432 Incisional hernia without obstruction or gangrene: Secondary | ICD-10-CM | POA: Diagnosis not present

## 2020-05-18 DIAGNOSIS — Z9049 Acquired absence of other specified parts of digestive tract: Secondary | ICD-10-CM | POA: Diagnosis not present

## 2020-05-18 HISTORY — PX: INCISIONAL HERNIA REPAIR: SHX193

## 2020-05-18 HISTORY — PX: LAPAROSCOPY: SHX197

## 2020-05-18 HISTORY — PX: INSERTION OF MESH: SHX5868

## 2020-05-18 LAB — BASIC METABOLIC PANEL
Anion gap: 11 (ref 5–15)
BUN: 14 mg/dL (ref 8–23)
CO2: 27 mmol/L (ref 22–32)
Calcium: 9.1 mg/dL (ref 8.9–10.3)
Chloride: 101 mmol/L (ref 98–111)
Creatinine, Ser: 0.78 mg/dL (ref 0.61–1.24)
GFR, Estimated: >60 mL/min (ref >60)
Glucose, Bld: 187 mg/dL — ABNORMAL HIGH (ref 70–99)
Potassium: 3.7 mmol/L (ref 3.5–5.1)
Sodium: 139 mmol/L (ref 135–145)

## 2020-05-18 LAB — CBC
HCT: 37.2 % — ABNORMAL LOW (ref 39.0–52.0)
Hemoglobin: 13 g/dL (ref 13.0–17.0)
MCH: 29.1 pg (ref 26.0–34.0)
MCHC: 34.9 g/dL (ref 30.0–36.0)
MCV: 83.4 fL (ref 80.0–100.0)
Platelets: 262 10*3/uL (ref 150–400)
RBC: 4.46 MIL/uL (ref 4.22–5.81)
RDW: 13.5 % (ref 11.5–15.5)
WBC: 7.1 10*3/uL (ref 4.0–10.5)
nRBC: 0 % (ref 0.0–0.2)

## 2020-05-18 LAB — GLUCOSE, CAPILLARY
Glucose-Capillary: 174 mg/dL — ABNORMAL HIGH (ref 70–99)
Glucose-Capillary: 155 mg/dL — ABNORMAL HIGH (ref 70–99)

## 2020-05-18 SURGERY — REPAIR, HERNIA, INCISIONAL
Anesthesia: Regional | Site: Abdomen

## 2020-05-18 MED ORDER — ONDANSETRON HCL 4 MG/2ML IJ SOLN
INTRAMUSCULAR | Status: DC | PRN
  Administered 2020-05-18: 4 mg via INTRAVENOUS

## 2020-05-18 MED ORDER — BUPIVACAINE HCL (PF) 0.25 % IJ SOLN
INTRAMUSCULAR | Status: DC | PRN
  Administered 2020-05-18: 30 mL

## 2020-05-18 MED ORDER — PHENYLEPHRINE 40 MCG/ML (10ML) SYRINGE FOR IV PUSH (FOR BLOOD PRESSURE SUPPORT)
PREFILLED_SYRINGE | INTRAVENOUS | Status: AC
  Filled 2020-05-18: qty 10

## 2020-05-18 MED ORDER — BUPIVACAINE HCL (PF) 0.25 % IJ SOLN
INTRAMUSCULAR | Status: AC
  Filled 2020-05-18: qty 30

## 2020-05-18 MED ORDER — EPHEDRINE SULFATE-NACL 50-0.9 MG/10ML-% IV SOSY
PREFILLED_SYRINGE | INTRAVENOUS | Status: DC | PRN
  Administered 2020-05-18: 5 mg via INTRAVENOUS

## 2020-05-18 MED ORDER — FENTANYL CITRATE (PF) 100 MCG/2ML IJ SOLN: 25.0000 ug | INTRAMUSCULAR | Status: DC | PRN

## 2020-05-18 MED ORDER — PROPOFOL 10 MG/ML IV BOLUS
INTRAVENOUS | Status: AC
  Filled 2020-05-18: qty 40

## 2020-05-18 MED ORDER — ACETAMINOPHEN 650 MG RE SUPP: 650.0000 mg | RECTAL | Status: DC | PRN

## 2020-05-18 MED ORDER — SODIUM CHLORIDE 0.9% FLUSH: 3.0000 mL | INTRAVENOUS | Status: DC | PRN

## 2020-05-18 MED ORDER — ONDANSETRON HCL 4 MG/2ML IJ SOLN: 4.0000 mg | Freq: Once | INTRAMUSCULAR | Status: DC | PRN

## 2020-05-18 MED ORDER — BUPIVACAINE HCL (PF) 0.5 % IJ SOLN
INTRAMUSCULAR | Status: DC | PRN
  Administered 2020-05-18: 30 mL via PERINEURAL

## 2020-05-18 MED ORDER — LACTATED RINGERS IV SOLN: INTRAVENOUS | Status: DC

## 2020-05-18 MED ORDER — PROPOFOL 10 MG/ML IV BOLUS
INTRAVENOUS | Status: DC | PRN
  Administered 2020-05-18: 200 mg via INTRAVENOUS

## 2020-05-18 MED ORDER — LIDOCAINE 2% (20 MG/ML) 5 ML SYRINGE
INTRAMUSCULAR | Status: DC | PRN
  Administered 2020-05-18: 40 mg via INTRAVENOUS

## 2020-05-18 MED ORDER — SUGAMMADEX SODIUM 200 MG/2ML IV SOLN
INTRAVENOUS | Status: DC | PRN
  Administered 2020-05-18 (×3): 100 mg via INTRAVENOUS

## 2020-05-18 MED ORDER — OXYCODONE HCL 5 MG PO TABS: 5.0000 mg | ORAL_TABLET | Freq: Four times a day (QID) | ORAL | 0 refills | Status: DC | PRN

## 2020-05-18 MED ORDER — DEXAMETHASONE SODIUM PHOSPHATE 10 MG/ML IJ SOLN
INTRAMUSCULAR | Status: DC | PRN
  Administered 2020-05-18: 10 mg via INTRAVENOUS

## 2020-05-18 MED ORDER — ONDANSETRON HCL 4 MG/2ML IJ SOLN
INTRAMUSCULAR | Status: AC
  Filled 2020-05-18: qty 2

## 2020-05-18 MED ORDER — FENTANYL CITRATE (PF) 250 MCG/5ML IJ SOLN
INTRAMUSCULAR | Status: AC
  Filled 2020-05-18: qty 5

## 2020-05-18 MED ORDER — SUGAMMADEX SODIUM 500 MG/5ML IV SOLN
INTRAVENOUS | Status: AC
  Filled 2020-05-18: qty 5

## 2020-05-18 MED ORDER — 0.9 % SODIUM CHLORIDE (POUR BTL) OPTIME
TOPICAL | Status: DC | PRN
  Administered 2020-05-18: 1000 mL

## 2020-05-18 MED ORDER — ORAL CARE MOUTH RINSE: 15.0000 mL | Freq: Once | OROMUCOSAL | Status: AC

## 2020-05-18 MED ORDER — MIDAZOLAM HCL 5 MG/5ML IJ SOLN
INTRAMUSCULAR | Status: DC | PRN
  Administered 2020-05-18 (×2): 1 mg via INTRAVENOUS

## 2020-05-18 MED ORDER — CHLORHEXIDINE GLUCONATE 0.12 % MT SOLN
15.0000 mL | Freq: Once | OROMUCOSAL | Status: AC
  Administered 2020-05-18: 15 mL via OROMUCOSAL
  Filled 2020-05-18: qty 15

## 2020-05-18 MED ORDER — SODIUM CHLORIDE 0.9% FLUSH: 3.0000 mL | Freq: Two times a day (BID) | INTRAVENOUS | Status: DC

## 2020-05-18 MED ORDER — PHENYLEPHRINE 40 MCG/ML (10ML) SYRINGE FOR IV PUSH (FOR BLOOD PRESSURE SUPPORT)
PREFILLED_SYRINGE | INTRAVENOUS | Status: DC | PRN
  Administered 2020-05-18 (×2): 80 ug via INTRAVENOUS
  Administered 2020-05-18: 120 ug via INTRAVENOUS
  Administered 2020-05-18: 80 ug via INTRAVENOUS

## 2020-05-18 MED ORDER — CEFAZOLIN SODIUM-DEXTROSE 2-4 GM/100ML-% IV SOLN
2.0000 g | INTRAVENOUS | Status: AC
  Administered 2020-05-18: 2 g via INTRAVENOUS
  Filled 2020-05-18: qty 100

## 2020-05-18 MED ORDER — ROCURONIUM BROMIDE 10 MG/ML (PF) SYRINGE
PREFILLED_SYRINGE | INTRAVENOUS | Status: AC
  Filled 2020-05-18: qty 10

## 2020-05-18 MED ORDER — ENSURE PRE-SURGERY PO LIQD: 296.0000 mL | Freq: Once | ORAL | Status: DC

## 2020-05-18 MED ORDER — ACETAMINOPHEN 500 MG PO TABS
1000.0000 mg | ORAL_TABLET | ORAL | Status: AC
  Administered 2020-05-18: 1000 mg via ORAL
  Filled 2020-05-18: qty 2

## 2020-05-18 MED ORDER — ROCURONIUM BROMIDE 10 MG/ML (PF) SYRINGE
PREFILLED_SYRINGE | INTRAVENOUS | Status: DC | PRN
  Administered 2020-05-18: 20 mg via INTRAVENOUS
  Administered 2020-05-18: 60 mg via INTRAVENOUS

## 2020-05-18 MED ORDER — ACETAMINOPHEN 325 MG PO TABS: 650.0000 mg | ORAL_TABLET | ORAL | Status: DC | PRN

## 2020-05-18 MED ORDER — SODIUM CHLORIDE 0.9 % IV SOLN: INTRAVENOUS | Status: DC

## 2020-05-18 MED ORDER — BUPIVACAINE-EPINEPHRINE 0.25% -1:200000 IJ SOLN: INTRAMUSCULAR | Status: DC | PRN

## 2020-05-18 MED ORDER — MORPHINE SULFATE (PF) 2 MG/ML IV SOLN: 2.0000 mg | INTRAVENOUS | Status: DC | PRN

## 2020-05-18 MED ORDER — DEXAMETHASONE SODIUM PHOSPHATE 10 MG/ML IJ SOLN
INTRAMUSCULAR | Status: AC
  Filled 2020-05-18: qty 1

## 2020-05-18 MED ORDER — SODIUM CHLORIDE 0.9 % IV SOLN: 250.0000 mL | INTRAVENOUS | Status: DC | PRN

## 2020-05-18 MED ORDER — KETOROLAC TROMETHAMINE 15 MG/ML IJ SOLN: 15.0000 mg | Freq: Once | INTRAMUSCULAR | Status: DC | PRN

## 2020-05-18 MED ORDER — OXYCODONE HCL 5 MG PO TABS: 5.0000 mg | ORAL_TABLET | ORAL | Status: DC | PRN

## 2020-05-18 MED ORDER — LIDOCAINE 2% (20 MG/ML) 5 ML SYRINGE
INTRAMUSCULAR | Status: AC
  Filled 2020-05-18: qty 5

## 2020-05-18 MED ORDER — MIDAZOLAM HCL 2 MG/2ML IJ SOLN
INTRAMUSCULAR | Status: AC
  Filled 2020-05-18: qty 2

## 2020-05-18 MED ORDER — BUPIVACAINE HCL (PF) 0.25 % IJ SOLN
INTRAMUSCULAR | Status: DC | PRN
  Administered 2020-05-18: 11 mL

## 2020-05-18 MED ORDER — FENTANYL CITRATE (PF) 250 MCG/5ML IJ SOLN
INTRAMUSCULAR | Status: DC | PRN
  Administered 2020-05-18: 50 ug via INTRAVENOUS
  Administered 2020-05-18: 100 ug via INTRAVENOUS

## 2020-05-18 SURGICAL SUPPLY — 43 items
APPLIER CLIP 5 13 M/L LIGAMAX5 (MISCELLANEOUS)
BLADE CLIPPER SURG (BLADE) IMPLANT
CANISTER SUCT 3000ML PPV (MISCELLANEOUS) ×2 IMPLANT
CHLORAPREP W/TINT 26 (MISCELLANEOUS) ×2 IMPLANT
CLIP APPLIE 5 13 M/L LIGAMAX5 (MISCELLANEOUS) IMPLANT
COVER SURGICAL LIGHT HANDLE (MISCELLANEOUS) ×2 IMPLANT
COVER WAND RF STERILE (DRAPES) ×2 IMPLANT
DERMABOND ADVANCED (GAUZE/BANDAGES/DRESSINGS) ×1
DERMABOND ADVANCED .7 DNX12 (GAUZE/BANDAGES/DRESSINGS) ×1 IMPLANT
DEVICE SECURE STRAP 25 ABSORB (INSTRUMENTS) ×2 IMPLANT
DRAPE LAPAROSCOPIC ABDOMINAL (DRAPES) ×2 IMPLANT
ELECT CAUTERY BLADE 6.4 (BLADE) ×2 IMPLANT
ELECT REM PT RETURN 9FT ADLT (ELECTROSURGICAL) ×2
ELECTRODE REM PT RTRN 9FT ADLT (ELECTROSURGICAL) ×1 IMPLANT
GAUZE SPONGE 4X4 12PLY STRL (GAUZE/BANDAGES/DRESSINGS) IMPLANT
GLOVE BIO SURGEON STRL SZ7 (GLOVE) ×2 IMPLANT
GLOVE BIOGEL PI IND STRL 7.5 (GLOVE) ×1 IMPLANT
GLOVE BIOGEL PI INDICATOR 7.5 (GLOVE) ×1
GOWN STRL REUS W/ TWL LRG LVL3 (GOWN DISPOSABLE) ×3 IMPLANT
GOWN STRL REUS W/TWL LRG LVL3 (GOWN DISPOSABLE) ×3
KIT BASIN OR (CUSTOM PROCEDURE TRAY) ×2 IMPLANT
KIT TURNOVER KIT B (KITS) ×2 IMPLANT
MESH VENTRALEX ST 2.5 CRC MED (Mesh General) ×2 IMPLANT
NS IRRIG 1000ML POUR BTL (IV SOLUTION) ×2 IMPLANT
PACK GENERAL/GYN (CUSTOM PROCEDURE TRAY) ×2 IMPLANT
PAD ARMBOARD 7.5X6 YLW CONV (MISCELLANEOUS) ×2 IMPLANT
PENCIL SMOKE EVACUATOR (MISCELLANEOUS) ×2 IMPLANT
SCISSORS LAP 5X35 DISP (ENDOMECHANICALS) IMPLANT
SET IRRIG TUBING LAPAROSCOPIC (IRRIGATION / IRRIGATOR) IMPLANT
SET TUBE SMOKE EVAC HIGH FLOW (TUBING) ×2 IMPLANT
SLEEVE ENDOPATH XCEL 5M (ENDOMECHANICALS) ×2 IMPLANT
SUT ETHIBOND 0 MO6 C/R (SUTURE) ×2 IMPLANT
SUT MNCRL AB 4-0 PS2 18 (SUTURE) ×2 IMPLANT
SUT NOV 1 T60/GS (SUTURE) IMPLANT
SUT NOVA NAB GS-21 0 18 T12 DT (SUTURE) IMPLANT
SUT VIC AB 3-0 SH 18 (SUTURE) ×2 IMPLANT
TOWEL GREEN STERILE (TOWEL DISPOSABLE) ×2 IMPLANT
TOWEL GREEN STERILE FF (TOWEL DISPOSABLE) ×2 IMPLANT
TRAY FOLEY MTR SLVR 14FR STAT (SET/KITS/TRAYS/PACK) IMPLANT
TRAY LAPAROSCOPIC MC (CUSTOM PROCEDURE TRAY) ×2 IMPLANT
TROCAR XCEL BLUNT TIP 100MML (ENDOMECHANICALS) IMPLANT
TROCAR XCEL NON-BLD 11X100MML (ENDOMECHANICALS) IMPLANT
TROCAR XCEL NON-BLD 5MMX100MML (ENDOMECHANICALS) ×2 IMPLANT

## 2020-05-18 NOTE — Anesthesia Procedure Notes (Signed)
Procedure Name: Intubation Date/Time: 05/18/2020 7:38 AM Performed by: Thelma Comp, CRNA Pre-anesthesia Checklist: Patient identified, Emergency Drugs available, Suction available and Patient being monitored Patient Re-evaluated:Patient Re-evaluated prior to induction Oxygen Delivery Method: Circle System Utilized Preoxygenation: Pre-oxygenation with 100% oxygen Induction Type: IV induction Ventilation: Mask ventilation without difficulty Laryngoscope Size: Mac and 4 Grade View: Grade I Tube type: Oral Tube size: 7.5 mm Number of attempts: 1 Airway Equipment and Method: Stylet and Oral airway Placement Confirmation: ETT inserted through vocal cords under direct vision,  positive ETCO2 and breath sounds checked- equal and bilateral Secured at: 22 cm Tube secured with: Tape Dental Injury: Teeth and Oropharynx as per pre-operative assessment

## 2020-05-18 NOTE — Transfer of Care (Signed)
Immediate Anesthesia Transfer of Care Note  Patient: David Pearson  Procedure(s) Performed: INCISIONAL HERNIA REPAIR (N/A Abdomen) LAPAROSCOPY DIAGNOSTIC (N/A Abdomen) INSERTION OF MESH (N/A Abdomen)  Patient Location: PACU  Anesthesia Type:GA combined with regional for post-op pain  Level of Consciousness: awake, alert , patient cooperative and responds to stimulation  Airway & Oxygen Therapy: Patient Spontanous Breathing and Patient connected to face mask oxygen  Post-op Assessment: Report given to RN, Post -op Vital signs reviewed and stable, Patient moving all extremities and Patient moving all extremities X 4  Post vital signs: Reviewed and stable  Last Vitals:  Vitals Value Taken Time  BP 133/91   Temp    Pulse 81 05/18/20 0842  Resp 13 05/18/20 0842  SpO2 100 % 05/18/20 0842  Vitals shown include unvalidated device data.  Last Pain:  Vitals:   05/18/20 0622  TempSrc: Oral  PainSc:          Complications: No complications documented.

## 2020-05-18 NOTE — Anesthesia Postprocedure Evaluation (Signed)
Anesthesia Post Note  Patient: Kashton Mcartor III  Procedure(s) Performed: INCISIONAL HERNIA REPAIR (N/A Abdomen) LAPAROSCOPY DIAGNOSTIC (N/A Abdomen) INSERTION OF MESH (N/A Abdomen)     Patient location during evaluation: PACU Anesthesia Type: Regional and General Level of consciousness: awake Pain management: pain level controlled Vital Signs Assessment: post-procedure vital signs reviewed and stable Respiratory status: spontaneous breathing, nonlabored ventilation, respiratory function stable and patient connected to nasal cannula oxygen Cardiovascular status: blood pressure returned to baseline and stable Postop Assessment: no apparent nausea or vomiting Anesthetic complications: no   No complications documented.  Last Vitals:  Vitals:   05/18/20 0900 05/18/20 0915  BP: 119/71 136/89  Pulse: 72 67  Resp: 15 14  Temp:    SpO2: 94% 96%    Last Pain:  Vitals:   05/18/20 0915  TempSrc:   PainSc: 0-No pain                 Kinslea Frances P Ladrea Holladay

## 2020-05-18 NOTE — Discharge Instructions (Signed)
General Anesthesia, Adult, Care After This sheet gives you information about how to care for yourself after your procedure. Your health care provider may also give you more specific instructions. If you have problems or questions, contact your health care provider. What can I expect after the procedure? After the procedure, the following side effects are common:  Pain or discomfort at the IV site.  Nausea.  Vomiting.  Sore throat.  Trouble concentrating.  Feeling cold or chills.  Weak or tired.  Sleepiness and fatigue.  Soreness and body aches. These side effects can affect parts of the body that were not involved in surgery. Follow these instructions at home:  For at least 24 hours after the procedure:  Have a responsible adult stay with you. It is important to have someone help care for you until you are awake and alert.  Rest as needed.  Do not: ? Participate in activities in which you could fall or become injured. ? Drive. ? Use heavy machinery. ? Drink alcohol. ? Take sleeping pills or medicines that cause drowsiness. ? Make important decisions or sign legal documents. ? Take care of children on your own. Eating and drinking  Follow any instructions from your health care provider about eating or drinking restrictions.  When you feel hungry, start by eating small amounts of foods that are soft and easy to digest (bland), such as toast. Gradually return to your regular diet.  Drink enough fluid to keep your urine pale yellow.  If you vomit, rehydrate by drinking water, juice, or clear broth. General instructions  If you have sleep apnea, surgery and certain medicines can increase your risk for breathing problems. Follow instructions from your health care provider about wearing your sleep device: ? Anytime you are sleeping, including during daytime naps. ? While taking prescription pain medicines, sleeping medicines, or medicines that make you drowsy.  Return to  your normal activities as told by your health care provider. Ask your health care provider what activities are safe for you.  Take over-the-counter and prescription medicines only as told by your health care provider.  If you smoke, do not smoke without supervision.  Keep all follow-up visits as told by your health care provider. This is important. Contact a health care provider if:  You have nausea or vomiting that does not get better with medicine.  You cannot eat or drink without vomiting.  You have pain that does not get better with medicine.  You are unable to pass urine.  You develop a skin rash.  You have a fever.  You have redness around your IV site that gets worse. Get help right away if:  You have difficulty breathing.  You have chest pain.  You have blood in your urine or stool, or you vomit blood. Summary  After the procedure, it is common to have a sore throat or nausea. It is also common to feel tired.  Have a responsible adult stay with you for the first 24 hours after general anesthesia. It is important to have someone help care for you until you are awake and alert.  When you feel hungry, start by eating small amounts of foods that are soft and easy to digest (bland), such as toast. Gradually return to your regular diet.  Drink enough fluid to keep your urine pale yellow.  Return to your normal activities as told by your health care provider. Ask your health care provider what activities are safe for you. This information is not   intended to replace advice given to you by your health care provider. Make sure you discuss any questions you have with your health care provider. Document Revised: 05/23/2017 Document Reviewed: 01/03/2017 Elsevier Patient Education  2020 [facility], [place]  UMBILICAL OR INGUINAL HERNIA REPAIR: POST OP INSTRUCTIONS  Always review your discharge instruction sheet given to you by the facility where  your surgery was performed. IF YOU HAVE DISABILITY OR FAMILY LEAVE FORMS, YOU MUST BRING THEM TO THE OFFICE FOR PROCESSING.   DO NOT GIVE THEM TO YOUR DOCTOR.  1. A  prescription for pain medication may be given to you upon discharge.  Take your pain medication as prescribed, if needed.  If narcotic pain medicine is not needed, then you may take acetaminophen (Tylenol), naprosyn (Alleve) or ibuprofen (Advil) as needed. 2. Take your usually prescribed medications unless otherwise directed. 3. If you need a refill on your pain medication, please contact your pharmacy.  They will contact our office to request authorization. Prescriptions will not be filled after 5 pm or on week-ends. 4. You should follow a light diet the first 24 hours after arrival home, such as soup and crackers, etc.  Be sure to include lots of fluids daily.  Resume your normal diet the day after surgery. 5. Most patients will experience some swelling and bruising around the umbilicus or in the groin and scrotum.  Ice packs and reclining will help.  Swelling and bruising can take several days to resolve.  6. It is common to experience some constipation if taking pain medication after surgery.  Increasing fluid intake and taking a stool softener (such as Colace) will usually help or prevent this problem from occurring.  A mild laxative (Milk of Magnesia or Miralax) should be taken according to package directions if there are no bowel movements after 48 hours. 7. Unless discharge instructions indicate otherwise, you may remove your bandages 48 hours after surgery, and you may shower at that time.  You may have steri-strips (small skin tapes) in place directly over the incision.  These strips should be left on the skin for 7-10 days and will come off on their own.  If your surgeon used skin glue on the incision, you may shower in 24 hours.  The glue will flake off over the next 2-3 weeks.  Any sutures or staples will be removed at the office  during your follow-up visit. 8. ACTIVITIES:  You may resume regular (light) daily activities beginning the next day--such as daily self-care, walking, climbing stairs--gradually increasing activities as tolerated.  You may have sexual intercourse when it is comfortable.  Refrain from any heavy lifting or straining until approved by your doctor. a. You may drive when you are no longer taking prescription pain medication, you can comfortably wear a seatbelt, and you can safely maneuver your car and apply brakes. b. RETURN TO WORK:  __________________________________________________________ 9. You should see your doctor in the office for a follow-up appointment approximately 2-3 weeks after your surgery.  Make sure that you call for this appointment within a day or two after you arrive home to insure a convenient appointment time. 10. OTHER INSTRUCTIONS:  __________________________________________________________________________________________________________________________________________________________________________________________  WHEN TO CALL YOUR DOCTOR: 1. Fever over 101.0 2. Inability to urinate 3. Nausea and/or vomiting 4. Extreme swelling or bruising 5. Continued bleeding from incision. 6. Increased pain, redness, or drainage from the incision  The clinic staff is available to answer your questions during regular business hours.  Please  don't hesitate to call and ask to speak to one of the nurses for clinical concerns.  If you have a medical emergency, go to the nearest emergency room or call 911.  A surgeon from [facility] is always on call at the hospital   [address], [place], [place], [place]  [address] ?  [address], [place], [place]   [address] [phone] ? [phone] ? FAX [phone] Web site: [email or web address]

## 2020-05-18 NOTE — Op Note (Signed)
Preoperative diagnosis: Umbilical incisional hernia Postoperative diagnosis: Same as above Procedure: Laparoscopic assisted umbilical incisional hernia repair Surgeon: Dr. Serita Grammes Anesthesia: General with bilateral tap block Estimated blood loss: Minimal Drains: None Complications: None Specimens: None Special count was correct at completion Disposition recovery stable condition  Indications:69 yom with multiple groin hernia repairs, abdominoplasty (thinks he had prior uh repair) who I did a lap subtotal cholecystectomy on in 2019 for gangrenous cholecystitis. he had UH at that time. I did fix this primarily at the time but has recurred. it is increasing in size and always reduces but does cause him some discomfort. We discussed an open possible laparoscopic repair with mesh.  Procedure: After informed consent was obtained the patient first underwent bilateral tap block with anesthesia.  He was given antibiotics.  SCDs were in place.  He was then placed under general anesthesia without complication.  He was prepped and draped in the standard sterile surgical fashion.  Surgical timeout was then performed.  I infiltrated Marcaine below his umbilicus.  I made a curvilinear incision.  I dissected down to the umbilical stalk.  There was a fair amount of scar tissue from his prior abdominal plasty.  I then was able to identify hernia was obtained which was about a centimeter in size.  Due to the fact that this was incisional in nature and I had repaired this with sutures when I took his gallbladder out I elected to place mesh.  I then inserted a 6.4 cm Ventralex patch into the abdomen.  I then secured the straps as well as the mesh with a 0 Ethibond.  I then closed the fascia with 0 Ethibond.  I did place a 5 mm laparoscope using Optiview trocar in the left upper quadrant.  The mesh appeared to be in good position.  There was a small piece of omentum that was adherent that went away.  I did  place several tacks through another port to make sure that this was secured to the abdominal wall.  I then removed the trocars and desufflated the abdominal wall.  I then closed the incisions with 4-0 Monocryl.  The umbilicus was tacked down with 3-0 Vicryl sutures.  I then closed the umbilicus with 3-0 Vicryl and 4-0 Monocryl.  Glue and Steri-Strips were applied.  He tolerated this well was extubated and transferred to recovery stable.

## 2020-05-18 NOTE — H&P (Signed)
  89 yom with multiple groin hernia repairs, abdominoplasty (thinks he had prior uh repair) who I did a lap subtotal cholecystectomy on in 2019 for gangrenous cholecystitis. he had UH at that time. I did fix this primarily at the time but has recurred. it is increasing in size and always reduces but does cause him some discomfort. he is due an egd soon for fobt positive. has recent colonoscopy. here to discuss options   Past Surgical History  Cataract Surgery  Bilateral. Gallbladder Surgery - Laparoscopic  Laparoscopic Inguinal Hernia Surgery  Bilateral. multiple Open Inguinal Hernia Surgery  Bilateral.  Diagnostic Studies History  Colonoscopy  1-5 years ago >10 years ago  Allergies  No Known Drug Allergies Allergies Reconciled   Medication History  Atorvastatin Calcium (40MG  Tablet, Oral) Active. Iron (325 (65 Fe)MG Tablet, Oral) Active. metFORMIN HCl (1000MG  Tablet, Oral) Active. Omeprazole (40MG  Capsule DR, Oral) Active. Viagra (100MG  Tablet, Oral) Active. Medications Reconciled  Social History  Alcohol use  Occasional alcohol use. 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 Tobacco use  Former smoker.  Family History  Family history unknown  First Degree Relatives   Other Problems Back Pain  Diabetes Mellitus  Gastroesophageal Reflux Disease  Inguinal Hernia  Umbilical Hernia Repair  Ventral Hernia Repair    Review of Systems  General Present- Weight Gain. Not Present- Appetite Loss, Chills, Fatigue, Fever, Night Sweats and Weight Loss. Skin Present- Dryness. Not Present- Change in Wart/Mole, Hives, Jaundice, New Lesions, Non-Healing Wounds, Rash and Ulcer. HEENT Present- Ringing in the Ears and Seasonal Allergies. Not Present- Earache, Hearing Loss, Hoarseness, Nose Bleed, Oral Ulcers, Sinus Pain, Sore Throat, Visual Disturbances, Wears glasses/contact lenses and Yellow Eyes. Respiratory Present- Snoring. Not Present- Bloody  sputum, Chronic Cough, Difficulty Breathing and Wheezing. Breast Not Present- Breast Mass, Breast Pain, Nipple Discharge and Skin Changes. Cardiovascular Not Present- Chest Pain, Difficulty Breathing Lying Down, Leg Cramps, Palpitations, Rapid Heart Rate, Shortness of Breath and Swelling of Extremities. Male Genitourinary Present- Frequency and Impotence. Not Present- Blood in Urine, Change in Urinary Stream, Nocturia, Painful Urination, Urgency and Urine Leakage. Neurological Not Present- Decreased Memory, Fainting, Headaches, Numbness, Seizures, Tingling, Tremor, Trouble walking and Weakness. Psychiatric Not Present- Anxiety, Bipolar, Change in Sleep Pattern, Depression, Fearful and Frequent crying. Endocrine Not Present- Cold Intolerance, Excessive Hunger, Hair Changes, Heat Intolerance, Hot flashes and New Diabetes. Hematology Not Present- Blood Thinners, Easy Bruising, Excessive bleeding, Gland problems, HIV and Persistent Infections.    Physical Exam  General Mental Status-Alert. Orientation-Oriented X3. Abdomen Note: multiple healed incisions 1.5 cm umbilical incisional hernia present, nontender, easily reducible   Assessment & Plan  INCISIONAL HERNIA (K43.2) Story: Open incisional hernia repair with mesh, possible laparoscopy I recommended repair. Not surprising that this has recurred. I think reasonable to do open repair with mesh anteriorly as it is small. I might put laparoscope in at same time also to ensure placed well and fix it also. we discussed risks and recovery as well

## 2020-05-18 NOTE — Addendum Note (Signed)
Addendum  created 05/18/20 1409 by Darletta Moll, CRNA   Charge Capture section accepted

## 2020-05-18 NOTE — Anesthesia Procedure Notes (Signed)
Anesthesia Regional Block: TAP block   Pre-Anesthetic Checklist: ,, timeout performed, Correct Patient, Correct Site, Correct Laterality, Correct Procedure, Correct Position, site marked, Risks and benefits discussed,  Surgical consent,  Pre-op evaluation,  At surgeon's request and post-op pain management  Laterality: Right  Prep: chloraprep       Needles:  Injection technique: Single-shot  Needle Type: Echogenic Stimulator Needle     Needle Length: 10cm  Needle Gauge: 20     Additional Needles:   Procedures:,,,, ultrasound used (permanent image in chart),,,,  Narrative:  Start time: 05/18/2020 6:50 AM End time: 05/18/2020 7:00 AM Injection made incrementally with aspirations every 5 mL.  Performed by: Personally  Anesthesiologist: Murvin Natal, MD  Additional Notes: Functioning IV was confirmed and monitors were applied.  A timeout was performed. Sterile prep, hand hygiene and sterile gloves were used. A 129mm 20ga BBraun echogenic stimulator needle was used. Negative aspiration and negative test dose prior to incremental administration of local anesthetic. The patient tolerated the procedure well.  Ultrasound guidance: relevent anatomy identified, needle position confirmed, local anesthetic spread visualized around nerve(s), vascular puncture avoided.  Image printed for medical record.

## 2020-05-18 NOTE — Anesthesia Procedure Notes (Signed)
Anesthesia Regional Block: TAP block   Pre-Anesthetic Checklist: ,, timeout performed, Correct Patient, Correct Site, Correct Laterality, Correct Procedure, Correct Position, site marked, Risks and benefits discussed,  Surgical consent,  Pre-op evaluation,  At surgeon's request and post-op pain management  Laterality: Left  Prep: chloraprep       Needles:  Injection technique: Single-shot  Needle Type: Echogenic Stimulator Needle     Needle Length: 10cm  Needle Gauge: 20     Additional Needles:   Procedures:,,,, ultrasound used (permanent image in chart),,,,  Narrative:  Start time: 05/18/2020 7:00 AM End time: 05/18/2020 7:10 AM Injection made incrementally with aspirations every 5 mL.  Performed by: Personally  Anesthesiologist: Murvin Natal, MD  Additional Notes: Functioning IV was confirmed and monitors were applied.  A timeout was performed. Sterile prep, hand hygiene and sterile gloves were used. A 143mm 20ga BBraun echogenic stimulator needle was used. Negative aspiration and negative test dose prior to incremental administration of local anesthetic. The patient tolerated the procedure well.  Ultrasound guidance: relevent anatomy identified, needle position confirmed, local anesthetic spread visualized around nerve(s), vascular puncture avoided.  Image printed for medical record.

## 2020-05-18 NOTE — Interval H&P Note (Signed)
History and Physical Interval Note:  05/18/2020 7:09 AM  David Pearson  has presented today for surgery, with the diagnosis of INCISIONAL HERNIA.  The various methods of treatment have been discussed with the patient and family. After consideration of risks, benefits and other options for treatment, the patient has consented to  Procedure(s): [place] (N/A) POSSIBLE LAPAROSCOPY DIAGNOSTIC (N/A) as a surgical intervention.  The patient's history has been reviewed, patient examined, no change in status, stable for surgery.  I have reviewed the patient's chart and labs.  Questions were answered to the patient's satisfaction.     Rolm Bookbinder

## 2020-05-19 ENCOUNTER — Encounter (HOSPITAL_COMMUNITY): Payer: Self-pay | Admitting: General Surgery

## 2020-05-30 ENCOUNTER — Telehealth: Payer: Self-pay | Admitting: Gastroenterology

## 2020-05-30 NOTE — Telephone Encounter (Signed)
The pt has been rescheduled to 06/29/20 and COVID rescheduled to 1/24.  Th pt has been advised and sent new instructions to My Chart per pt preference.

## 2020-05-30 NOTE — Telephone Encounter (Signed)
Inbound call from patient stating he tested positive for covid and is needing to reschedule his procedure at [facility] on 06/08/20 please.

## 2020-06-03 DIAGNOSIS — T8143XA Infection following a procedure, organ and space surgical site, initial encounter: Secondary | ICD-10-CM

## 2020-06-03 HISTORY — DX: Infection following a procedure, organ and space surgical site, initial encounter: T81.43XA

## 2020-06-05 ENCOUNTER — Other Ambulatory Visit (HOSPITAL_COMMUNITY): Payer: Managed Care, Other (non HMO)

## 2020-06-21 ENCOUNTER — Encounter: Payer: Self-pay | Admitting: Family Medicine

## 2020-06-26 ENCOUNTER — Encounter (HOSPITAL_COMMUNITY): Payer: Self-pay | Admitting: Physician Assistant

## 2020-06-26 ENCOUNTER — Other Ambulatory Visit (HOSPITAL_COMMUNITY)
Admission: RE | Admit: 2020-06-26 | Discharge: 2020-06-26 | Disposition: A | Discharge status: Home / Self Care | Payer: Managed Care, Other (non HMO) | Source: Ambulatory Visit | Attending: Gastroenterology | Admitting: Gastroenterology

## 2020-06-26 DIAGNOSIS — U071 COVID-19: Secondary | ICD-10-CM | POA: Diagnosis not present

## 2020-06-26 DIAGNOSIS — Z01812 Encounter for preprocedural laboratory examination: Secondary | ICD-10-CM | POA: Diagnosis present

## 2020-06-26 LAB — SARS CORONAVIRUS 2 (TAT 6-24 HRS): SARS Coronavirus 2: POSITIVE — AB

## 2020-06-27 ENCOUNTER — Telehealth: Payer: Self-pay

## 2020-06-27 ENCOUNTER — Telehealth: Payer: Self-pay | Admitting: Physician Assistant

## 2020-06-27 NOTE — Telephone Encounter (Signed)
Thanks for update. GM 

## 2020-06-27 NOTE — Telephone Encounter (Signed)
I spoke with the pt and he tells me that he no symptoms and has completley recovered from [place].  He did get retested although he was positive back in December.  He would like to proceed with procedure as planned.

## 2020-06-27 NOTE — Telephone Encounter (Signed)
-----   Message from Irving Copas., MD sent at 06/27/2020  5:00 AM EST ----- Regarding: FW: David Pearson, This patient tested positive for COVID-19.  With that being said he was positive per report back in December which led to rescheduling.  Please call and check on him and ask if he has recovered or has any symptoms.  Then please update [name] and myself to ensure his ability to have procedure at his scheduled time. Thank you. GM ----- Message ----- From: Buel Ream, Lab In [place] Sent: 06/26/2020   9:29 PM EST To: Irving Copas., MD

## 2020-06-27 NOTE — Telephone Encounter (Signed)
Called to discuss with patient about Covid symptoms and the use of a monoclonal antibody infusion for those with mild to moderate Covid symptoms and at a high risk of hospitalization.   Patient stated that he had [place] in December and got antibody and has been feeling grate since then. " This is incidental findings".    Pt is not qualified for the monoclonal antibody infusion or antiviral tx due to asymptomatic infection. Preventative practices reviewed. Patient verbalized understanding.  [place], [place]  06/27/2020 10:42 AM

## 2020-06-28 ENCOUNTER — Telehealth: Payer: Self-pay

## 2020-06-28 NOTE — Progress Notes (Signed)
Patient tested negative for covid on 05/15/20 for a Hernia Repair Surgery.  Patient tested positive for covid on 06/26/20 for his upcoming EGD procedure.  Patient states he performed a home covid test and is not able to provide a copy of the positive covid results.  I spoke with Larene Beach in Endoscopy, who consulted with Dr Gifford Shave about this situation.  Dr Gifford Shave said to cancel the procedure. Patient informed of cancellation.  Patient verbalized that he was not happy about the procedure being cancelled.

## 2020-06-28 NOTE — Telephone Encounter (Signed)
I spoke with David Pearson. Because it was an at-home test, our Anesthesia group will not allow this to count as the test for which he was "positive" for in December. Such that the current COVID test has to count as his "Positive" test. I am sorry about the patient and actions but can understand the frustration. I will try to call him at some point today to clarify the issue since this is the Hospital protocol in regards to the testing if it is not a true test in administered in the system. I am placing Dr. Havery Moros in this chain so he is kept UTD on the frustrations of his patient. I will see if he wants to be rescheduled or if he wants to wait for Dr. Havery Moros to take care of this. Thank you. GM

## 2020-06-28 NOTE — Telephone Encounter (Signed)
Sorry to hear about this. I have spoken with leadership at endoscopy and unfortunately it is hospital / anesthesia policy regarding the COVID test he had pre-procedure. I am going to speak with leadership of hospital system about this to see why he needed to repeat a COVID test after having COVID like illness last month with a positive home test, as it was likely that his hospital test was going to be positive with this history. If I hear otherwise and that he can proceed tomorrow I will let you know. Otherwise he will have to wait 10 days from his positive hospital COVID test to have his procedure done, I will get back to you. Thanks for letting me know

## 2020-06-28 NOTE — Telephone Encounter (Signed)
Noted  

## 2020-06-28 NOTE — Telephone Encounter (Signed)
Dr Rush Landmark endo called and states that because the pt did an at home test in December and no copy is in the system he will need to be cancelled.  The pt was very rude yesterday when I spoke with him and endo states he "cussed" out the pre admission team.  Looks like they have already taken him off of your schedule.

## 2020-06-28 NOTE — Telephone Encounter (Signed)
Pt called asking for you. He stated hat the hospital cancelled his procedure for tomorrow because he tested positive for Covid.

## 2020-06-29 ENCOUNTER — Encounter (HOSPITAL_COMMUNITY): Admission: RE | Payer: Self-pay | Source: Home / Self Care

## 2020-06-29 ENCOUNTER — Ambulatory Visit (HOSPITAL_COMMUNITY)
Admission: RE | Admit: 2020-06-29 | Payer: Managed Care, Other (non HMO) | Source: Home / Self Care | Admitting: Gastroenterology

## 2020-06-29 SURGERY — UPPER ENDOSCOPIC ULTRASOUND (EUS) RADIAL
Anesthesia: Monitor Anesthesia Care

## 2020-06-29 SURGERY — ESOPHAGOGASTRODUODENOSCOPY (EGD) WITH PROPOFOL
Anesthesia: Monitor Anesthesia Care

## 2020-06-29 NOTE — Telephone Encounter (Signed)
Thanks for update but sorry about situation. As you stated that you were going to reach out to patient I did not. Please let me know if I can still be of assistance and we can try to reschedule as soon as able if he desires. Thank you. GM

## 2020-06-29 NOTE — Telephone Encounter (Signed)
I called the patient and had a discussion with him this evening about what happened and why his procedure was cancelled. I spoke with hospital leadership and we thought it was reasonable to proceed with the endoscopy given his Covid antibodies will be positive after having clinical Covid and a positive antigen test in December. Unfortunately, infection control policy at the hospital is rather strict about this issue and no endoscopic or operative cases can be done without a hospital-based Covid test in place, no exceptions can be made, and unfortunately his case was canceled for today. Moving forward, I emphasized to him that we want to continue to take care of him and remove this polyp for him. We can schedule his procedure 10 days from his positive Covid test and he does not have to retest prior to proceeding. He wishes to proceed with rescheduling this exam with Dr. Rush Landmark if Dr. Rush Landmark can accommodate him.   Wynetta Fines the patient would like to reschedule with you at your earliest convenience. I am not sure when your next opening is or if you have any spots to accommodate him in the next month or so.  [place] can you help schedule Mr. Haughn for a follow-up EGD with EMR of gastric polyp, we will be awaiting your phone call at some point later this week or next week. Can you please touch with base with Patty about the schedule.   Thank you all for your help with this

## 2020-06-30 NOTE — Telephone Encounter (Signed)
[place] I will work on this.  Thank you

## 2020-07-03 ENCOUNTER — Other Ambulatory Visit: Payer: Self-pay

## 2020-07-03 DIAGNOSIS — K317 Polyp of stomach and duodenum: Secondary | ICD-10-CM

## 2020-07-03 NOTE — Telephone Encounter (Signed)
SA, I do not have any appointments I think for at least the next 6 weeks.  However, if the hospital let me try to accommodate him this coming Friday, since it would be more than 10 days since his positive test I am happy to try to be available for this.  60-minute EGD/EUS with EMR.  Patty if that does not work then get him scheduled as soon as I have an availability.  Thank you. GM

## 2020-07-03 NOTE — Telephone Encounter (Signed)
The pt has been scheduled for 07/26/20 at 845 am.  NO COVID test needed.  I have sent new instructions to the pt via My Chart per his request and also called him over the phone and discussed.  He will call with any questions or concerns when reviewing the information.

## 2020-07-03 NOTE — Telephone Encounter (Signed)
Thanks to you both for your help with this case.  David Pearson

## 2020-07-04 NOTE — Telephone Encounter (Signed)
Thanks for update. GM 

## 2020-07-19 ENCOUNTER — Other Ambulatory Visit: Payer: Self-pay

## 2020-07-19 ENCOUNTER — Encounter (HOSPITAL_COMMUNITY): Payer: Self-pay | Admitting: Gastroenterology

## 2020-07-25 NOTE — Anesthesia Preprocedure Evaluation (Addendum)
Anesthesia Evaluation  Patient identified by MRN, date of birth, ID band Patient awake    Reviewed: Allergy & Precautions, NPO status , Patient's Chart, lab work & pertinent test results  Airway Mallampati: II  TM Distance: >3 FB Neck ROM: Full    Dental no notable dental hx. (+) Teeth Intact, Dental Advisory Given   Pulmonary former smoker,    Pulmonary exam normal breath sounds clear to auscultation       Cardiovascular Exercise Tolerance: Good Normal cardiovascular exam Rhythm:Regular Rate:Normal     Neuro/Psych negative psych ROS   GI/Hepatic Neg liver ROS, GERD  ,  Endo/Other  diabetes, Type 2, Oral Hypoglycemic Agents  Renal/GU negative Renal ROS     Musculoskeletal  (+) Arthritis ,   Abdominal (+) + obese,   Peds  Hematology Lab Results      Component                Value               Date                      WBC                      7.1                 05/18/2020                HGB                      13.0                05/18/2020                HCT                      37.2 (L)            05/18/2020                MCV                      83.4                05/18/2020                PLT                      262                 05/18/2020          .last   Anesthesia Other Findings   Reproductive/Obstetrics                           Anesthesia Physical Anesthesia Plan  ASA: III  Anesthesia Plan: MAC   Post-op Pain Management:    Induction:   PONV Risk Score and Plan: Treatment may vary due to age or medical condition  Airway Management Planned: Natural Airway  Additional Equipment: None  Intra-op Plan:   Post-operative Plan:   Informed Consent: I have reviewed the patients History and Physical, chart, labs and discussed the procedure including the risks, benefits and alternatives for the proposed anesthesia with the patient or authorized representative who has  indicated his/her understanding and acceptance.     Dental advisory given  Plan Discussed with: CRNA and Anesthesiologist  Anesthesia Plan Comments:        Anesthesia Quick Evaluation

## 2020-07-26 ENCOUNTER — Ambulatory Visit (HOSPITAL_COMMUNITY): Payer: Managed Care, Other (non HMO) | Admitting: Anesthesiology

## 2020-07-26 ENCOUNTER — Ambulatory Visit (HOSPITAL_COMMUNITY)
Admission: RE | Admit: 2020-07-26 | Discharge: 2020-07-26 | Disposition: A | Discharge status: Home / Self Care | Payer: Managed Care, Other (non HMO) | Source: Ambulatory Visit | Attending: Gastroenterology | Admitting: Gastroenterology

## 2020-07-26 ENCOUNTER — Encounter (HOSPITAL_COMMUNITY): Payer: Self-pay | Admitting: Gastroenterology

## 2020-07-26 ENCOUNTER — Other Ambulatory Visit: Payer: Self-pay

## 2020-07-26 ENCOUNTER — Encounter (HOSPITAL_COMMUNITY)
Admission: RE | Disposition: A | Discharge status: Home / Self Care | Payer: Self-pay | Source: Ambulatory Visit | Attending: Gastroenterology

## 2020-07-26 DIAGNOSIS — Z825 Family history of asthma and other chronic lower respiratory diseases: Secondary | ICD-10-CM | POA: Diagnosis not present

## 2020-07-26 DIAGNOSIS — K317 Polyp of stomach and duodenum: Secondary | ICD-10-CM | POA: Diagnosis present

## 2020-07-26 DIAGNOSIS — D131 Benign neoplasm of stomach: Secondary | ICD-10-CM | POA: Diagnosis not present

## 2020-07-26 DIAGNOSIS — K2980 Duodenitis without bleeding: Secondary | ICD-10-CM | POA: Insufficient documentation

## 2020-07-26 DIAGNOSIS — Z87891 Personal history of nicotine dependence: Secondary | ICD-10-CM | POA: Diagnosis not present

## 2020-07-26 DIAGNOSIS — D509 Iron deficiency anemia, unspecified: Secondary | ICD-10-CM | POA: Diagnosis not present

## 2020-07-26 DIAGNOSIS — Z803 Family history of malignant neoplasm of breast: Secondary | ICD-10-CM | POA: Insufficient documentation

## 2020-07-26 DIAGNOSIS — Z8269 Family history of other diseases of the musculoskeletal system and connective tissue: Secondary | ICD-10-CM | POA: Diagnosis not present

## 2020-07-26 HISTORY — PX: BIOPSY: SHX5522

## 2020-07-26 HISTORY — PX: HEMOSTASIS CLIP PLACEMENT: SHX6857

## 2020-07-26 HISTORY — PX: ESOPHAGOGASTRODUODENOSCOPY (EGD) WITH PROPOFOL: SHX5813

## 2020-07-26 HISTORY — PX: POLYPECTOMY: SHX5525

## 2020-07-26 HISTORY — PX: ENDOSCOPIC MUCOSAL RESECTION: SHX6839

## 2020-07-26 HISTORY — PX: SCLEROTHERAPY: SHX6841

## 2020-07-26 HISTORY — PX: EUS: SHX5427

## 2020-07-26 LAB — GLUCOSE, CAPILLARY: Glucose-Capillary: 154 mg/dL — ABNORMAL HIGH (ref 70–99)

## 2020-07-26 SURGERY — UPPER ENDOSCOPIC ULTRASOUND (EUS) RADIAL
Anesthesia: Monitor Anesthesia Care

## 2020-07-26 MED ORDER — PROPOFOL 500 MG/50ML IV EMUL
INTRAVENOUS | Status: DC | PRN
  Administered 2020-07-26 (×8): 50 mg via INTRAVENOUS
  Administered 2020-07-26: 100 mg via INTRAVENOUS

## 2020-07-26 MED ORDER — SODIUM CHLORIDE (PF) 0.9 % IJ SOLN
PREFILLED_SYRINGE | INTRAMUSCULAR | Status: DC | PRN
  Administered 2020-07-26: 1 mL

## 2020-07-26 MED ORDER — PROPOFOL 10 MG/ML IV BOLUS
INTRAVENOUS | Status: AC
  Filled 2020-07-26: qty 20

## 2020-07-26 MED ORDER — LACTATED RINGERS IV SOLN: INTRAVENOUS | Status: DC

## 2020-07-26 MED ORDER — SODIUM CHLORIDE 0.9 % IV SOLN: INTRAVENOUS | Status: DC

## 2020-07-26 MED ORDER — GLYCOPYRROLATE PF 0.2 MG/ML IJ SOSY
PREFILLED_SYRINGE | INTRAMUSCULAR | Status: DC | PRN
  Administered 2020-07-26: .2 mg via INTRAVENOUS

## 2020-07-26 MED ORDER — LIDOCAINE HCL (CARDIAC) PF 100 MG/5ML IV SOSY
PREFILLED_SYRINGE | INTRAVENOUS | Status: DC | PRN
  Administered 2020-07-26: 50 mg via INTRATRACHEAL

## 2020-07-26 MED ORDER — PROPOFOL 500 MG/50ML IV EMUL
INTRAVENOUS | Status: AC
  Filled 2020-07-26: qty 50

## 2020-07-26 MED ORDER — OMEPRAZOLE 40 MG PO CPDR: 40.0000 mg | DELAYED_RELEASE_CAPSULE | Freq: Two times a day (BID) | ORAL | 1 refills | Status: DC

## 2020-07-26 MED ORDER — LACTATED RINGERS IV SOLN
INTRAVENOUS | Status: DC
  Administered 2020-07-26: 1000 mL via INTRAVENOUS

## 2020-07-26 SURGICAL SUPPLY — 14 items

## 2020-07-26 NOTE — Transfer of Care (Addendum)
Immediate Anesthesia Transfer of Care Note  Patient: David Pearson  Procedure(s) Performed: UPPER ENDOSCOPIC ULTRASOUND (EUS) RADIAL (N/A ) ESOPHAGOGASTRODUODENOSCOPY (EGD) WITH PROPOFOL (N/A ) ENDOSCOPIC MUCOSAL RESECTION (N/A ) BIOPSY POLYPECTOMY SCLEROTHERAPY HEMOSTASIS CLIP PLACEMENT  Patient Location: PACU  Anesthesia Type:MAC  Level of Consciousness: drowsy, patient cooperative and responds to stimulation  Airway & Oxygen Therapy: Patient Spontanous Breathing  Post-op Assessment: Report given to RN and Post -op Vital signs reviewed and stable  Post vital signs: Reviewed and stable  Last Vitals:  Vitals Value Taken Time  BP 104/73 07/26/20 0959  Temp    Pulse 87 07/26/20 1004  Resp 17 07/26/20 1004  SpO2 95 % 07/26/20 1004  Vitals shown include unvalidated device data.  Last Pain:  Vitals:   07/26/20 0959  TempSrc:   PainSc: 0-No pain         Complications: No complications documented.

## 2020-07-26 NOTE — Anesthesia Postprocedure Evaluation (Signed)
Anesthesia Post Note  Patient: David Pearson  Procedure(s) Performed: UPPER ENDOSCOPIC ULTRASOUND (EUS) RADIAL (N/A ) ESOPHAGOGASTRODUODENOSCOPY (EGD) WITH PROPOFOL (N/A ) ENDOSCOPIC MUCOSAL RESECTION (N/A ) BIOPSY POLYPECTOMY SCLEROTHERAPY HEMOSTASIS CLIP PLACEMENT     Patient location during evaluation: Endoscopy Anesthesia Type: MAC Level of consciousness: awake and alert Pain management: pain level controlled Vital Signs Assessment: post-procedure vital signs reviewed and stable Respiratory status: spontaneous breathing, nonlabored ventilation, respiratory function stable and patient connected to nasal cannula oxygen Cardiovascular status: blood pressure returned to baseline and stable Postop Assessment: no apparent nausea or vomiting Anesthetic complications: no   No complications documented.  Last Vitals:  Vitals:   07/26/20 1010 07/26/20 1020  BP: 140/83 134/81  Pulse: 91 80  Resp: (!) 27 16  Temp:    SpO2: 97% 94%    Last Pain:  Vitals:   07/26/20 1020  TempSrc:   PainSc: 0-No pain                 Barnet Glasgow

## 2020-07-26 NOTE — H&P (Signed)
GASTROENTEROLOGY PROCEDURE H&P NOTE   Primary Care Physician: Tammi Sou, MD  HPI: David Pearson is a 70 y.o. male who presents for EGD/EUS with possible advanced resection of large gastric hyperplastic polyp thought source for GI blood loss.  Past Medical History:  Diagnosis Date  . 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 Cataract   . Diabetes mellitus without complication (HCC)    type 2, no meds  . Diverticulosis    on colonoscopy 2020  . ED (erectile dysfunction)   . GERD (gastroesophageal reflux disease)   . Hearing loss 02/25/2019   bilateral - [facility]  . History of iron deficiency anemia    2020.  PO iron x 1 yr.  Hemoccults 1/3 pos 12/2019 (when Hb and iron back to normal)->recommended GI ref  . Hx of adenomatous polyp of colon 2020   sessile serrated polyp x 1->recall 5 yrs (Dr. Havery Moros)  . Hyperlipidemia   . Osteoarthritis of left knee    Hx of several steroid injections and these helped, meloxicam prn as of 12/2019  . Sleep apnea    does not use cpap  . Trigger thumb of left hand 2021   inj x 2 by ortho   Past Surgical History:  Procedure Laterality Date  . ABDOMINAL HERNIA REPAIR  03/12/2018   abd wall hernia repair x 2  . ABDOMINOPLASTY    . cataracts    . CHOLECYSTECTOMY N/A 03/12/2018   Procedure: LAPAROSCOPIC CHOLECYSTECTOMY;  Surgeon: Rolm Bookbinder, MD;  Location: [facility];  Service: General;  Laterality: N/A;  . COLONOSCOPY  06/12/2018   sessile serrated polyp x 1, recall 2025  . HERNIA REPAIR     3 inguinal hernias  . INCISIONAL HERNIA REPAIR N/A 05/18/2020   Procedure: INCISIONAL HERNIA REPAIR;  Surgeon: Rolm Bookbinder, MD;  Location: [facility];  Service: General;  Laterality: N/A;  . INSERTION OF MESH N/A 05/18/2020   Procedure: INSERTION OF MESH;  Surgeon: Rolm Bookbinder, MD;  Location: [place];  Service: General;  Laterality: N/A;  . LAPAROSCOPY N/A  05/18/2020   Procedure: LAPAROSCOPY DIAGNOSTIC;  Surgeon: Rolm Bookbinder, MD;  Location: [facility];  Service: General;  Laterality: N/A;  . TONSILLECTOMY     with adenoids   Current Facility-Administered Medications  Medication Dose Route Frequency Provider Last Rate Last Admin  . 0.9 %  sodium chloride infusion   Intravenous Continuous Mansouraty, Telford Nab., MD      . lactated ringers infusion   Intravenous Continuous Mansouraty, Telford Nab., MD       No Known Allergies Family History  Problem Relation Age of Onset  . Breast cancer Maternal Aunt   . Cancer Father   . Muscular dystrophy Father   . COPD Mother   . COPD Sister   . Colon cancer Neg Hx   . Stomach cancer Neg Hx   . Rectal cancer Neg Hx   . Esophageal cancer Neg Hx   . Colon polyps Neg Hx    Social History   Socioeconomic History  . Marital status: Married    Spouse name: Not on file  . Number of children: Not on file  . Years of education: Not on file  . Highest education level: Not on file  Occupational History  . Not on file  Tobacco Use  . Smoking status: Former Smoker    Years: 5.00    Types: Cigarettes  Quit date: 2014    Years since quitting: 8.1  . Smokeless tobacco: Never Used  Vaping Use  . Vaping Use: Never used  Substance and Sexual Activity  . Alcohol use: Yes    Alcohol/week: 4.0 standard drinks    Types: 4 Standard drinks or equivalent per week  . Drug use: Not Currently  . Sexual activity: Yes  Other Topics Concern  . Not on file  Social History Narrative   Married, one daughter and one son.   Occup: Lobbyist, retired 2021.   Tobacco:quit approx 2016, 25 pack yr hx.   Alc: social         Social Determinants of Radio broadcast assistant Strain: Not on file  Food Insecurity: Not on file  Transportation Needs: Not on file  Physical Activity: Not on file  Stress: Not on file  Social Connections: Not on file  Intimate Partner Violence: Not on file    Physical  Exam: Vital signs in last 24 hours:     GEN: NAD EYE: Sclerae anicteric ENT: MMM CV: Non-tachycardic GI: Soft, NT/ND NEURO:  Alert & Oriented x 3  Lab Results: No results for input(s): WBC, HGB, HCT, PLT in the last 72 hours. BMET No results for input(s): NA, K, CL, CO2, GLUCOSE, BUN, CREATININE, CALCIUM in the last 72 hours. LFT No results for input(s): PROT, ALBUMIN, AST, ALT, ALKPHOS, BILITOT, BILIDIR, IBILI in the last 72 hours. PT/INR No results for input(s): LABPROT, INR in the last 72 hours.   Impression / Plan: This is a 70 y.o.male who presents for EGD/EUS with possible advanced resection of large gastric hyperplastic polyp thought source for GI blood loss.  Based upon the description and endoscopic pictures I do feel that it is reasonable to pursue an Advanced Polypectomy attempt of the polyp/lesion.  We discussed some of the techniques of advanced polypectomy which include Endoscopic Mucosal Resection, OVESCO Full-Thickness Resection, Endorotor Morcellation, and Tissue Ablation via Fulguration.  We also reviewed images of typical techniques as noted above.  The risks and benefits of endoscopic evaluation were discussed with the patient; these include but are not limited to the risk of perforation, infection, bleeding, missed lesions, lack of diagnosis, severe illness requiring hospitalization, as well as anesthesia and sedation related illnesses.  During attempts at advanced resection, the risks of bleeding and perforation/leak are increased as opposed to diagnostic and screening procedures, and that was discussed with the patient as well.   In addition, I explained that with the possible need for piecemeal resection, subsequent short-interval endoscopic evaluation for follow up and potential retreatment of the lesion/area may be necessary.  If, after attempt at removal of the polyp/lesion, it is found that the patient has a complication or that an invasive lesion or malignant  lesion is found, or that the polyp/lesion continues to recur, the patient is aware and understands that surgery may still be indicated/required.  All patient questions were answered, to the best of my ability, and the patient agrees to the aforementioned plan of action with follow-up as indicated.  The risks and benefits of endoscopic evaluation were discussed with the patient; these include but are not limited to the risk of perforation, infection, bleeding, missed lesions, lack of diagnosis, severe illness requiring hospitalization, as well as anesthesia and sedation related illnesses.  The patient is agreeable to proceed.    Justice Britain, MD [facility] Advanced Endoscopy Office # [phone]

## 2020-07-26 NOTE — Op Note (Addendum)
[facility] Patient Name: David Pearson Procedure Date: 07/26/2020 MRN: [id number] Attending MD: Justice Britain , MD Date of Birth: 19-Nov-1950 CSN: [id number] Age: 70 Admit Type: Outpatient Procedure:                Upper EUS Indications:              Gastric mucosal mass/polyp found on endoscopy, Iron                            deficiency anemia, Gastric polyps, For therapy of                            gastric polyps Providers:                Justice Britain, MD, Jeanella Cara, RN,                            Elspeth Cho Tech., Technician Referring MD:             Carlota Raspberry. Havery Moros, MD, Tammi Sou Medicines:                Monitored Anesthesia Care Complications:            No immediate complications. Estimated Blood Loss:     Estimated blood loss was minimal. Procedure:                Pre-Anesthesia Assessment:                           - Prior to the procedure, a History and Physical                            was performed, and patient medications and                            allergies were reviewed. The patient's tolerance of                            previous anesthesia was also reviewed. The risks                            and benefits of the procedure and the sedation                            options and risks were discussed with the patient.                            All questions were answered, and informed consent                            was obtained. Prior Anticoagulants: The patient has                            taken no previous anticoagulant or antiplatelet  agents. ASA Grade Assessment: III - A patient with                            severe systemic disease. After reviewing the risks                            and benefits, the patient was deemed in                            satisfactory condition to undergo the procedure.                           After obtaining informed consent, the  endoscope was                            passed under direct vision. Throughout the                            procedure, the patient's blood pressure, pulse, and                            oxygen saturations were monitored continuously. The                            [id number] ([id number]) Olympus therapeutic endoscope                            was introduced through the mouth, and advanced to                            the second part of duodenum. The [id number]                            ([id number]) Olympus Radial EUS was introduced through                            the mouth, and advanced to the stomach for                            ultrasound examination. After obtaining informed                            consent, the endoscope was passed under direct                            vision. Throughout the procedure, the patient's                            blood pressure, pulse, and oxygen saturations were                            monitored continuously.The upper EUS was  accomplished without difficulty. The patient                            tolerated the procedure. Scope In: Scope Out: Findings:      ENDOSCOPIC FINDING: :      No gross lesions were noted in the entire esophagus.      The Z-line was irregular and was found 40 cm from the incisors.      Multiple 3 to 8 mm semi-sessile polyps with no bleeding and no stigmata       of recent bleeding were found in the gastric antrum and in the       prepyloric region of the stomach.      A single 25 mm pedunculated polyp with no bleeding and stigmata of       recent bleeding was found on the anterior wall of the gastric antrum.       After the EUS was completed, preparations were made for mucosal       resection. NBI imaging and White light endoscopy was done to demarcate       the borders of the lesion. A 1:100,000 solution of epinephrine was       injected to raise the lesion (total of 4 cc administered).  Olympus       polyloop placed around the polyp to further cinch the polyp. After 7       minutes from epinephrine injection and polyloop placement, the polyp       began to show signs in head of decreased blood flow. Snare mucosal       resection was performed. Resection and retrieval were complete. To       prevent bleeding after mucosal resection, two hemostatic clips were       successfully placed (MR conditional). There was no bleeding during, or       at the end, of the procedure.      One 10 mm sessile polyp with no bleeding was found in the duodenal bulb.       The polyp was removed with a cold snare. Resection and retrieval were       complete.      No other gross lesions were noted in the duodenal bulb, in the first       portion of the duodenum and in the second portion of the duodenum.       Biopsies for histology were taken with a cold forceps for evaluation of       celiac disease.      ENDOSONOGRAPHIC FINDING: :      A hypoechoic pedunculated lesion was identified endosonographically in       the antrum of the stomach. The mass measured 20 mm by 18 mm in maximal       cross-sectional diameter. The endosonographic borders were well-defined.      Endosonographic imaging in the visualized portion of the liver showed no       mass. Impression:               EGD Impression:                           - No gross lesions in esophagus. Z-line irregular,  40 cm from the incisors.                           - Multiple gastric polyps in antrum.                           - A single gastric polyp. After EUS completed,                            mucosal resection performed with epinephrine                            lift/polyloop placement and snare resection. Clips                            (MR conditional) were placed.                           - One duodenal polyp. Resected and retrieved.                           - No other gross lesions in the duodenal  bulb, in                            the first portion of the duodenum and in the second                            portion of the duodenum. Biopsied.                           EUS Impression:                           - A lesion was found in the antrum of the stomach.                            The diagnosis is consistent with previously                            biopsied hyperplastic polyp though dysplastic                            changes are possible. Moderate Sedation:      Not Applicable - Patient had care per Anesthesia. Recommendation:           - The patient will be observed post-procedure,                            until all discharge criteria are met.                           - Discharge patient to home.                           - Full liquid diet today. Advance diet tomorrow if  doing well.                           - Observe patient's clinical course.                           - No NSAIDs for next 2-weeks.                           - Omeprazole 40 mg twice daily for next 15-month                            Then return to once daily.                           - Monitor for signs/symptoms of bleeding,                            perforation, and infection. If issues please call                            our number to get further assistance as needed.                           - Follow up with primary GI for monitoring of blood                            counts and iron indices.                           - If issues persist in regards to his RUQ pain then                            may need to consider additional imaging (U/S v CT)                            will defer this to primary GI.                           - Await path results.                           - Repeat the upper endoscopic ultrasound for                            surveillance based on pathology results. If no                            evidence of dysplasia may not need follow  up of                            this region. If issues of IDA persist then consider  relook and ensure nothing has recurred, but may end                            up needing a VCE.                           - The findings and recommendations were discussed                            with the patient.                           - The findings and recommendations were discussed                            with the patient's family. Procedure Code(s):        --- Professional ---                           [phone], Esophagogastroduodenoscopy, flexible,                            transoral; with endoscopic mucosal resection                           [id number], Esophagogastroduodenoscopy, flexible,                            transoral; with endoscopic ultrasound examination                            limited to the esophagus, stomach or duodenum, and                            adjacent structures Diagnosis Code(s):        --- Professional ---                           K22.8, Other specified diseases of esophagus                           K31.7, Polyp of stomach and duodenum                           D13.1, Benign neoplasm of stomach                           K31.89, Other diseases of stomach and duodenum                           D50.9, Iron deficiency anemia, unspecified CPT copyright 2019 American Medical Association. All rights reserved. The codes documented in this report are preliminary and upon coder review may  be revised to meet current compliance requirements. Justice Britain, MD 07/26/2020 10:17:57 AM Number of Addenda: 0

## 2020-07-26 NOTE — Anesthesia Procedure Notes (Addendum)
Procedure Name: MAC Date/Time: 07/26/2020 9:05 AM Performed by: Michele Rockers, CRNA Pre-anesthesia Checklist: Patient identified, Emergency Drugs available, Suction available, Timeout performed and Patient being monitored Patient Re-evaluated:Patient Re-evaluated prior to induction Oxygen Delivery Method: Simple face mask

## 2020-07-26 NOTE — Discharge Instructions (Signed)
YOU HAD AN ENDOSCOPIC PROCEDURE TODAY: Refer to the procedure report and other information in the discharge instructions given to you for any specific questions about what was found during the examination. If this information does not answer your questions, please call [place] office at [phone] to clarify.   YOU SHOULD EXPECT: Some feelings of bloating in the abdomen. Passage of more gas than usual. Walking can help get rid of the air that was put into your GI tract during the procedure and reduce the bloating. If you had a lower endoscopy (such as a colonoscopy or flexible sigmoidoscopy) you may notice spotting of blood in your stool or on the toilet paper. Some abdominal soreness may be present for a day or two, also.  DIET: Your first meal following the procedure should be a light meal and then it is ok to progress to your normal diet. A half-sandwich or bowl of soup is an example of a good first meal. Heavy or fried foods are harder to digest and may make you feel nauseous or bloated. Drink plenty of fluids but you should avoid alcoholic beverages for 24 hours. If you had a esophageal dilation, please see attached instructions for diet.    ACTIVITY: Your care partner should take you home directly after the procedure. You should plan to take it easy, moving slowly for the rest of the day. You can resume normal activity the day after the procedure however YOU SHOULD NOT DRIVE, use power tools, machinery or perform tasks that involve climbing or major physical exertion for 24 hours (because of the sedation medicines used during the test).   SYMPTOMS TO REPORT IMMEDIATELY: A gastroenterologist can be reached at any hour. Please call [phone]  for any of the following symptoms:   Following upper endoscopy (EGD, EUS, ERCP, esophageal dilation) Vomiting of blood or coffee ground material  New, significant abdominal pain  New, significant chest pain or pain under the shoulder blades  Painful or  persistently difficult swallowing  New shortness of breath  Black, tarry-looking or red, bloody stools  FOLLOW UP:  If any biopsies were taken you will be contacted by phone or by letter within the next 1-3 weeks. Call [phone]  if you have not heard about the biopsies in 3 weeks.  Please also call with any specific questions about appointments or follow up tests.  

## 2020-07-27 ENCOUNTER — Encounter (HOSPITAL_COMMUNITY): Payer: Self-pay | Admitting: Gastroenterology

## 2020-07-27 LAB — SURGICAL PATHOLOGY

## 2020-07-28 ENCOUNTER — Telehealth: Payer: Self-pay

## 2020-07-28 ENCOUNTER — Encounter: Payer: Self-pay | Admitting: Gastroenterology

## 2020-07-28 NOTE — Telephone Encounter (Signed)
Spoke with patient, he did not want to repeat CBC as he says he is not anemic and has not taken iron supplement for years. Patient expressed concerns about RUQ pain that he has had since he was referred last year. Advised patient that there was no mention of RUQ pain, advsied that last visit was for GERD, IDA and getting set up for his colonoscopy. Advised patient that he will need to be seen, offered to schedule next available appt with Dr. Havery Moros (3/17), pt states "great, another 3 weeks." Offered him a sooner appt with an APP, he then asked "What are they going to do? Refer me to another Doctor?" Advised that they will do an assessment and advise on next steps. Patient states that he is upset that his RUQ pain was never addressed. Patient has been scheduled for 08/09/20 at 3 PM with Alonza Bogus. Dr. Havery Moros has been notified.

## 2020-07-28 NOTE — Telephone Encounter (Signed)
-----   Message from Yetta Flock, MD sent at 07/28/2020  1:10 PM EST ----- Chester Holstein thanks very much for your help with this case. [place] can you please coordinate a follow up CBC in 2 months for this patient. Thanks  ----- Message ----- From: Timothy Lasso, RN Sent: 07/28/2020  12:13 PM EST To: Tammi Sou, MD, Yetta Flock, MD

## 2020-08-01 ENCOUNTER — Telehealth: Payer: Self-pay | Admitting: Gastroenterology

## 2020-08-01 NOTE — Telephone Encounter (Signed)
Spoke with patient to offer him a sooner appt with Dr. Havery Pearson, patient's appt with David Pearson has been cancelled. Patient is scheduled for a follow up with Dr. Havery Pearson on Wednesday, 08/02/20 at 9 AM. Patient verbalized understanding and had no further concerns.

## 2020-08-02 ENCOUNTER — Encounter: Payer: Self-pay | Admitting: Gastroenterology

## 2020-08-02 ENCOUNTER — Other Ambulatory Visit: Payer: Self-pay

## 2020-08-02 ENCOUNTER — Ambulatory Visit (INDEPENDENT_AMBULATORY_CARE_PROVIDER_SITE_OTHER): Payer: Managed Care, Other (non HMO) | Admitting: Gastroenterology

## 2020-08-02 VITALS — BP 120/86 | HR 112 | Ht 69.5 in | Wt 228.2 lb

## 2020-08-02 DIAGNOSIS — D509 Iron deficiency anemia, unspecified: Secondary | ICD-10-CM | POA: Diagnosis not present

## 2020-08-02 DIAGNOSIS — K317 Polyp of stomach and duodenum: Secondary | ICD-10-CM

## 2020-08-02 DIAGNOSIS — R1031 Right lower quadrant pain: Secondary | ICD-10-CM | POA: Diagnosis not present

## 2020-08-02 NOTE — Progress Notes (Signed)
HPI :  70 year old male here for a follow-up visit for abdominal pain, history of iron deficiency, gastric polyps.  Recall that he was seen at the end of September for heme positive stool and history of iron deficiency.  His colonoscopy was in January 2020 which showed a 1 cm sessile serrated polyp, diverticulosis, and hemorrhoids. He underwent an EGD with me in November which showed amongst other things, most prominent finding was a very large gastric polyp that was inflamed.  Biopsies were consistent with hyperplastic polyp.  It was not removed at the [facility] due to concern for bleeding risk.  I referred him to Dr. Rush Landmark for removal.  He was scheduled to have this done but unfortunately developed Covid and it was canceled.  He was rescheduled in January however the hospital required him to test for Covid before procedure, not surprisingly the test was positive given he recently had Covid and despite our efforts to have the exam done at that time, per hospital policy his case had to be delayed.  He eventually had follow-up endoscopic with EUS with Dr. Rush Landmark on February 23.  He remove the polyp successfully, returned as hyperplastic without any dysplasia.  The patient has recovered from that and is feeling well without any bleeding symptoms thus far.  He has plans to follow-up his blood counts with his primary care physician.  Main concern today is right sided abdominal pain that has been bothering him.  He states this has been going on for a few months at this point in time.  He states it is present there most of the time and never really goes away, but the severity fluctuates.  At baseline it is typically mild, but he can have episodes where this is severe, and that lasts for a minute or so at a time and then resolves.  Removing the polyp made no difference to this pain.  It localizes to his right mid to lower quadrant.  He denies any change in his pain with bowel movements.  He has some occasional  loose stools at times, 3-4 times per day is typical.  No blood in his stools.  He eats well.  He did lose about 20 pounds from [place].  He does think there is some positional component to this.  If he lies on his side and that will reliably make his pain worse.  He denies any other triggers for this.  No abdominal trauma.  No blood in his stools.  He had a history of cholecystitis with cholecystectomy in 2019.  Prior work-up Colonoscopy 06/12/18 - The perianal and digital rectal examinations were normal. - Multiple medium-mouthed diverticula were found in the transverse colon, hepatic flexure and left colon. - A 10 mm polyp was found in the hepatic flexure. The polyp was sessile. The polyp was removed with a cold snare. Resection and retrieval were complete. - A 4 mm polyp was found in the hepatic flexure. The polyp was flat and snare would not grasp it. The polyp was removed with a cold biopsy forceps. Resection and retrieval were complete. - A 3 mm polyp was found in the descending colon. The polyp was sessile. The polyp was removed with a cold biopsy forceps. Resection and retrieval were complete. - Internal hemorrhoids were found during retroflexion. - The exam was otherwise without abnormality.  Surgical [P], hepatic flexure, descending, polyp (3) - SESSILE SERRATED POLYP (MULTIPLE FRAGMENTS). - HYPERPLASTIC POLYP (X2 FRAGMENTS). - NO DYSPLASIA OR MALIGNANCY.   EGD 04/20/20 - -  A 1 cm hiatal hernia was present. - The exam of the esophagus was otherwise normal. - A single large pedunculated polyp with a thick stalk with stigmata of recent bleeding (ulceration) was found in the gastric antrum. Given the size of the stalk this lesion is at higher risk for bleeding with polypectomy. Biopsies were taken with a cold forceps for histology initially. The lesion was quite friable and oozed for several minutes before stopping, it was observed for several minutes. Will discuss with patient and  plan to remove this at the hospital given bleeding risk. - Multiple benign appearing small sessile polyps were found in the gastric antrum and in the prepyloric region of the stomach. Biopsies were taken with a cold forceps for histology. - The exam of the stomach was otherwise normal. - Biopsies were taken with a cold forceps in the gastric body, at the incisura and in the gastric antrum for Helicobacter pylori testing. - A single 3 mm sessile polyp was found in the duodenal bulb. The polyp was removed with a cold biopsy forceps. Resection and retrieval were complete. - The exam of the duodenum was otherwise normal.  1. Surgical [P], duodenal polyp - PEPTIC DUODENITIS. - NO DYSPLASIA OR MALIGNANCY. 2. Surgical [P], gastric polyp - HYPERPLASTIC POLYP. - NO INTESTINAL METAPLASIA, DYSPLASIA, OR MALIGNANCY. 3. Surgical [P], gastric antrum and gastric body - REACTIVE GASTROPATHY. - FRAGMENT SUGGESTIVE OF FUNDIC GLAND POLYP. - WARTHIN-STARRY IS NEGATIVE FOR HELICOBACTER PYLORI. - NO INTESTINAL METAPLASIA, DYSPLASIA, OR MALIGNANCY.   EUS / EGD 07/26/20 - EGD Impression: - No gross lesions in esophagus. Z-line irregular, 40 cm from the incisors. - Multiple gastric polyps in antrum. - A single gastric polyp. After EUS completed, mucosal resection performed with epinephrine lift/polyloop placement and snare resection. Clips (MR conditional) were placed. - One duodenal polyp. Resected and retrieved. - No other gross lesions in the duodenal bulb, in the first portion of the duodenum and in the second portion of the duodenum. Biopsied. EUS Impression: - A lesion was found in the antrum of the stomach. The diagnosis is consistent with previously biopsied hyperplastic polyp though dysplastic changes are possible.  FINAL MICROSCOPIC DIAGNOSIS:   A. DUODENUM, BIOPSY:  - Duodenal mucosa with no significant pathologic findings.  - Negative for increased intraepithelial lymphocytes and villous   architectural changes.   B. DUODENUM, POLYPECTOMY:  - Nodular peptic duodenitis.  - Negative for dysplasia.   C. STOMACH, ENDOSCOPIC MUCOSAL RESECTION OF POLYP:  - Hyperplastic polyp.  - Negative for dysplasia.      Past Medical History:  Diagnosis Date  . 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 Cataract   . COVID-19   . Diabetes mellitus without complication (HCC)    type 2, no meds  . Diverticulosis    on colonoscopy 2020  . ED (erectile dysfunction)   . Gastric polyps   . GERD (gastroesophageal reflux disease)   . Hearing loss 02/25/2019   bilateral - [facility]  . History of iron deficiency anemia    2020.  PO iron x 1 yr.  Hemoccults 1/3 pos 12/2019 (when Hb and iron back to normal)->GI->gastric polyps on EGD.  Marland Kitchen Hx of adenomatous polyp of colon 2020   sessile serrated polyp x 1->recall 5 yrs (Dr. Havery Moros)  . Hyperlipidemia   . Osteoarthritis of left knee    Hx of several steroid injections and these helped, meloxicam prn as of  12/2019  . Sleep apnea    does not use cpap  . Trigger thumb of left hand 2021   inj x 2 by ortho     Past Surgical History:  Procedure Laterality Date  . ABDOMINAL HERNIA REPAIR  03/12/2018   abd wall hernia repair x 2  . ABDOMINOPLASTY    . BIOPSY  07/26/2020   Procedure: BIOPSY;  Surgeon: Rush Landmark Telford Nab., MD;  Location: [facility];  Service: Gastroenterology;;  . cataracts    . CHOLECYSTECTOMY N/A 03/12/2018   Procedure: LAPAROSCOPIC CHOLECYSTECTOMY;  Surgeon: Rolm Bookbinder, MD;  Location: [place];  Service: General;  Laterality: N/A;  . COLONOSCOPY  06/12/2018   sessile serrated polyp x 1, recall 2025  . ENDOSCOPIC MUCOSAL RESECTION N/A 07/26/2020   Procedure: ENDOSCOPIC MUCOSAL RESECTION;  Surgeon: Rush Landmark Telford Nab., MD;  Location: [facility];  Service: Gastroenterology;  Laterality: N/A;  . ESOPHAGOGASTRODUODENOSCOPY (EGD) WITH PROPOFOL  N/A 07/26/2020   Procedure: ESOPHAGOGASTRODUODENOSCOPY (EGD) WITH PROPOFOL;  Surgeon: Rush Landmark Telford Nab., MD;  Location: [facility];  Service: Gastroenterology;  Laterality: N/A;  . EUS N/A 07/26/2020   Procedure: UPPER ENDOSCOPIC ULTRASOUND (EUS) RADIAL;  Surgeon: Rush Landmark Telford Nab., MD;  Location: [facility];  Service: Gastroenterology;  Laterality: N/A;  . HEMOSTASIS CLIP PLACEMENT  07/26/2020   Procedure: HEMOSTASIS CLIP PLACEMENT;  Surgeon: Irving Copas., MD;  Location: [facility];  Service: Gastroenterology;;  . HERNIA REPAIR     3 inguinal hernias  . INCISIONAL HERNIA REPAIR N/A 05/18/2020   Procedure: INCISIONAL HERNIA REPAIR;  Surgeon: Rolm Bookbinder, MD;  Location: [place];  Service: General;  Laterality: N/A;  . INSERTION OF MESH N/A 05/18/2020   Procedure: INSERTION OF MESH;  Surgeon: Rolm Bookbinder, MD;  Location: [place];  Service: General;  Laterality: N/A;  . LAPAROSCOPY N/A 05/18/2020   Procedure: LAPAROSCOPY DIAGNOSTIC;  Surgeon: Rolm Bookbinder, MD;  Location: [place];  Service: General;  Laterality: N/A;  . POLYPECTOMY  07/26/2020   gastric polypectomy. Procedure: POLYPECTOMY;  Surgeon: Irving Copas., MD;  Location: [facility];  Service: Gastroenterology;;  . Clide Deutscher  07/26/2020   Procedure: Clide Deutscher;  Surgeon: Irving Copas., MD;  Location: [facility];  Service: Gastroenterology;;  . TONSILLECTOMY     with adenoids   Family History  Problem Relation Age of Onset  . Breast cancer Maternal Aunt   . Cancer Father   . Muscular dystrophy Father   . COPD Mother   . COPD Sister   . Colon cancer Neg Hx   . Stomach cancer Neg Hx   . Rectal cancer Neg Hx   . Esophageal cancer Neg Hx   . Colon polyps Neg Hx    Social History   Tobacco Use  . Smoking status: Former Smoker    Years: 5.00    Types: Cigarettes    Quit date: 2014    Years since quitting: 8.1  . Smokeless tobacco: Never Used  Vaping Use  .  Vaping Use: Never used  Substance Use Topics  . Alcohol use: Yes    Alcohol/week: 4.0 standard drinks    Types: 4 Standard drinks or equivalent per week  . Drug use: Not Currently   Current Outpatient Medications  Medication Sig Dispense Refill  . atorvastatin (LIPITOR) 40 MG tablet Take 1 tablet (40 mg total) by mouth daily. 90 tablet 3  . cholecalciferol (VITAMIN D) 1000 units tablet Take 1,000 Units by mouth daily.    . clobetasol cream (TEMOVATE) 0.05 % Apply 1  application topically as needed. (Patient taking differently: Apply 1 application topically 2 (two) times daily as needed (skin irritation (ears)).) 30 g 1  . metFORMIN (GLUCOPHAGE) 1000 MG tablet TAKE ONE TABLET BY MOUTH TWICE A DAY WITH A MEAL (Patient taking differently: Take 1,000 mg by mouth in the morning and at bedtime.) 180 tablet 3  . Multiple Vitamin (MULTIVITAMIN WITH MINERALS) TABS tablet Take 1 tablet by mouth daily.    Marland Kitchen omeprazole (PRILOSEC) 40 MG capsule Take 1 capsule (40 mg total) by mouth 2 (two) times daily before a meal. 60 capsule 1  . sildenafil (VIAGRA) 100 MG tablet Take 0.5-1 tablets (50-100 mg total) by mouth as needed for erectile dysfunction (for use prior to sexual activity). 30 tablet 3  . TRIAMCINOLONE ACETONIDE EX Apply 1 application topically 3 (three) times daily as needed (skin irritation (hands)).    . Vitamin E 450 MG (1000 UT) CAPS Take 1,000 Units by mouth daily.     No current facility-administered medications for this visit.   No Known Allergies   Review of Systems: All systems reviewed and negative except where noted in HPI.   Lab Results  Component Value Date   WBC 7.1 05/18/2020   HGB 13.0 05/18/2020   HCT 37.2 (L) 05/18/2020   MCV 83.4 05/18/2020   PLT 262 05/18/2020    Lab Results  Component Value Date   CREATININE 0.78 05/18/2020   BUN 14 05/18/2020   NA 139 05/18/2020   K 3.7 05/18/2020   CL 101 05/18/2020   CO2 27 05/18/2020    Lab Results  Component Value  Date   ALT 25 03/20/2020   AST 18 03/20/2020   ALKPHOS 92 03/20/2020   BILITOT 0.5 03/20/2020     Physical Exam: BP 120/86 (BP Location: Left Arm, Patient Position: Sitting, Cuff Size: Normal)   Pulse (!) 112   Ht 5' 9.5" (1.765 m) Comment: height measured without shoes  Wt 228 lb 4 oz (103.5 kg)   BMI 33.22 kg/m  Constitutional: Pleasant,well-developed, male in no acute distress. Abdominal: Soft, nondistended, some focal tenderness in the RLQ with positive Carnett. . There are no masses palpable.  Extremities: no edema Lymphadenopathy: No cervical adenopathy noted. Neurological: Alert and oriented to person place and time. Skin: Skin is warm and dry. No rashes noted. Psychiatric: Normal mood and affect. Behavior is normal.   ASSESSMENT AND PLAN: 70 year old male here for reassessment of the following  Right sided abdominal pain History of gastric polyps Iron deficiency anemia  As above, had a very large inflamed hyperplastic gastric polyp that was recently removed per Dr. Rush Landmark via EMR.  No dysplastic change.  I suspect this is the likely cause for his iron deficiency and will monitor his hemoglobin moving forward, he states he will have this followed up with his primary care doctor with routine labs.  He has recovered from that procedure well.  His main concern today is right-sided abdominal pain which localizes laterally to and inferior to the umbilicus.  He has a positive Carnett's sign on exam and there is some positional component to this.  I suspect he could have neuropathic pain / abdominal wall pain causing his symptoms, history and exam support this.  We discussed what this is, however I am recommending a CT scan abdomen pelvis to ensure no other intra-abdominal process that could be causing this otherwise.  Following discussion of this he wanted to proceed with a CT scan.  If the CT scan is  negative we can consider a trigger point injection in the office versus other  pain modalities such as gabapentin etc if his symptoms persist.  We discussed some topical therapies that can be effective such as capsaicin cream, he may try that in the interim empirically while we await the results of his CT scan.  He agreed with the plan, all questions answered, further recommendations pending the CT scan result.  [place] Cellar, MD [facility]

## 2020-08-02 NOTE — Patient Instructions (Addendum)
If you are age 68 or older, your body mass index should be between 23-30. Your Body mass index is 33.22 kg/m. If this is out of the aforementioned range listed, please consider follow up with your Primary Care Provider.  If you are age 37 or younger, your body mass index should be between 19-25. Your Body mass index is 33.22 kg/m. If this is out of the aformentioned range listed, please consider follow up with your Primary Care Provider.   You will be contacted by [facility] in the next 2 days to arrange a CT Scan of Abdomen and Pelvis.  The number on your caller ID will be [phone], please answer when they call.  If you have not heard from them in 2 days please call [phone] to schedule.     Thank you for entrusting me with your care and for choosing [facility], Dr. [place]me[place] Cellar

## 2020-08-09 ENCOUNTER — Ambulatory Visit: Payer: Managed Care, Other (non HMO) | Admitting: Gastroenterology

## 2020-08-09 ENCOUNTER — Telehealth: Payer: Self-pay | Admitting: Gastroenterology

## 2020-08-09 NOTE — Telephone Encounter (Signed)
Spoke with patient, he states that no one has called him to schedule the CT yet, patient requested that I send him the scheduling number via My Chart. My Chart message sent to patient with contact information, patient had no concerns at the end of the call.

## 2020-08-15 ENCOUNTER — Ambulatory Visit (HOSPITAL_COMMUNITY): Payer: Managed Care, Other (non HMO)

## 2020-08-22 ENCOUNTER — Encounter (HOSPITAL_COMMUNITY): Payer: Self-pay

## 2020-08-22 ENCOUNTER — Other Ambulatory Visit: Payer: Self-pay

## 2020-08-22 ENCOUNTER — Ambulatory Visit (HOSPITAL_COMMUNITY)
Admission: RE | Admit: 2020-08-22 | Discharge: 2020-08-22 | Disposition: A | Discharge status: Home / Self Care | Payer: Managed Care, Other (non HMO) | Source: Ambulatory Visit | Attending: Gastroenterology | Admitting: Gastroenterology

## 2020-08-22 DIAGNOSIS — R1031 Right lower quadrant pain: Secondary | ICD-10-CM | POA: Diagnosis present

## 2020-08-22 DIAGNOSIS — K317 Polyp of stomach and duodenum: Secondary | ICD-10-CM | POA: Diagnosis present

## 2020-08-22 DIAGNOSIS — D509 Iron deficiency anemia, unspecified: Secondary | ICD-10-CM

## 2020-08-22 LAB — POCT I-STAT CREATININE: Creatinine, Ser: 0.6 mg/dL — ABNORMAL LOW (ref 0.61–1.24)

## 2020-08-22 MED ORDER — IOHEXOL 300 MG/ML  SOLN
100.0000 mL | Freq: Once | INTRAMUSCULAR | Status: AC | PRN
  Administered 2020-08-22: 100 mL via INTRAVENOUS

## 2020-08-23 ENCOUNTER — Telehealth: Payer: Self-pay

## 2020-08-23 DIAGNOSIS — R1031 Right lower quadrant pain: Secondary | ICD-10-CM

## 2020-08-23 MED ORDER — METRONIDAZOLE 500 MG PO TABS: 500.0000 mg | ORAL_TABLET | Freq: Three times a day (TID) | ORAL | 0 refills | Status: AC

## 2020-08-23 MED ORDER — CIPROFLOXACIN HCL 500 MG PO TABS: 500.0000 mg | ORAL_TABLET | Freq: Two times a day (BID) | ORAL | 0 refills | Status: AC

## 2020-08-23 NOTE — Telephone Encounter (Signed)
Lab orders in epic.   Prescriptions sent to pharmacy on file.   Urgent Ambulatory referral to IR in epic.

## 2020-08-23 NOTE — Telephone Encounter (Signed)
I called Dr. Conard Novak the interpreting radiologist. Fluid collection underneath the muscle in the RLQ that is separate from the colon but near to it. Most concerning would be an abscess, also possible this is a hematoma or other. No clear etiology on the exam however for why this is there - no diverticulitis / mass lesion / fistula, appendicitis, etc. Pain has been ongoing for a few months which would be atypical for abscess. He has had no interval changes in his pain. No fevers.   Called the patient, went over all of this, no changes in his status since I have last seen him. Recommending he come into the office for labs, want to repeat CBC. Would also like to cover him with empiric antibiotics and refer to IR for drainage of fluid collection to see what this is. If it is an abscess, drainage may also make him feel better. He is in agreement with all of this.  [place] can you please help with the following: - refer to IR STAT for fluid collection drainage - rule out abscess. Can they drain the fluid, send for culture, gram stain, cell count, etc - please order flagyl 523m TID, cipro 5033mBID for 2 weeks - can you order CBC, ESR, CRP, he will come to the lab  Can you please keep me posted as to his appointment time with IR. Thanks

## 2020-08-23 NOTE — Telephone Encounter (Signed)
Received call report from [facility] in regards to patient's CT scan from today.   IMPRESSION: 1. There is a fluid collection along the internal table of the right abdominal wall, underlying the oblique musculature, measuring approximately 4.9 x 2.5 x 3.8 cm. There is adjacent fluid and fat stranding about the cecal base. Findings are concerning for abscess, although of uncertain etiology. Correlate for recent surgery or other instrumentation. 2. No cecal diverticulosis or other obvious bowel origin. The appendix is normal and in a medial lie, not closely adjacent to this collection. 3. Evidence of prior umbilical and bilateral inguinal hernia repair, overall postoperative findings of the abdomen not changed compared to prior examination dated 03/11/2018. 4. Descending and sigmoid diverticulosis without evidence of acute diverticulitis.  These results will be called to the ordering clinician or representative by the Radiologist Assistant, and communication documented in the PACS or [employer].  Aortic Atherosclerosis (ICD10-I70.0).

## 2020-08-23 NOTE — Addendum Note (Signed)
Addended by: Yevette Edwards on: 08/23/2020 12:57 PM   Modules accepted: Orders

## 2020-08-24 ENCOUNTER — Other Ambulatory Visit (INDEPENDENT_AMBULATORY_CARE_PROVIDER_SITE_OTHER): Payer: Managed Care, Other (non HMO)

## 2020-08-24 ENCOUNTER — Encounter: Payer: Self-pay | Admitting: Family Medicine

## 2020-08-24 DIAGNOSIS — R1031 Right lower quadrant pain: Secondary | ICD-10-CM | POA: Diagnosis not present

## 2020-08-24 LAB — CBC WITH DIFFERENTIAL/PLATELET
Basophils Absolute: 0 10*3/uL (ref 0.0–0.1)
Basophils Relative: 0.5 % (ref 0.0–3.0)
Eosinophils Absolute: 0.2 10*3/uL (ref 0.0–0.7)
Eosinophils Relative: 2 % (ref 0.0–5.0)
HCT: 34.3 % — ABNORMAL LOW (ref 39.0–52.0)
Hemoglobin: 11.3 g/dL — ABNORMAL LOW (ref 13.0–17.0)
Lymphocytes Relative: 19.9 % (ref 12.0–46.0)
Lymphs Abs: 1.7 10*3/uL (ref 0.7–4.0)
MCHC: 33 g/dL (ref 30.0–36.0)
MCV: 75.8 fl — ABNORMAL LOW (ref 78.0–100.0)
Monocytes Absolute: 0.6 10*3/uL (ref 0.1–1.0)
Monocytes Relative: 7.4 % (ref 3.0–12.0)
Neutro Abs: 5.9 10*3/uL (ref 1.4–7.7)
Neutrophils Relative %: 70.2 % (ref 43.0–77.0)
Platelets: 277 10*3/uL (ref 150.0–400.0)
RBC: 4.53 Mil/uL (ref 4.22–5.81)
RDW: 16.9 % — ABNORMAL HIGH (ref 11.5–15.5)
WBC: 8.5 10*3/uL (ref 4.0–10.5)

## 2020-08-24 LAB — HIGH SENSITIVITY CRP: CRP, High Sensitivity: 35.04 mg/L — ABNORMAL HIGH (ref 0.000–5.000)

## 2020-08-24 LAB — SEDIMENTATION RATE: Sed Rate: 51 mm/hr — ABNORMAL HIGH (ref 0–20)

## 2020-08-25 ENCOUNTER — Encounter (HOSPITAL_COMMUNITY): Payer: Self-pay | Admitting: Radiology

## 2020-08-25 ENCOUNTER — Other Ambulatory Visit: Payer: Self-pay

## 2020-08-25 DIAGNOSIS — D509 Iron deficiency anemia, unspecified: Secondary | ICD-10-CM

## 2020-08-25 DIAGNOSIS — R188 Other ascites: Secondary | ICD-10-CM

## 2020-08-25 DIAGNOSIS — R1031 Right lower quadrant pain: Secondary | ICD-10-CM

## 2020-08-25 NOTE — Addendum Note (Signed)
Addended by: Yevette Edwards on: 08/25/2020 09:15 AM   Modules accepted: Orders

## 2020-08-25 NOTE — Telephone Encounter (Signed)
Patient is scheduled for an appt with IR on Friday, 09/01/20 at 11 am, Dr. Havery Moros is aware.

## 2020-08-25 NOTE — Progress Notes (Signed)
David Pearson Male, 70 y.o., 04-07-1951  MRN:  [id number] Phone:  [phone] Jerilynn Mages)       PCP:  Tammi Sou, MD Primary Cvg:  [employer] Managed  Next Appt With Radiology ([facility]-CT 3) 09/01/2020 at 11:00 AM           RE: CT IMAGE GUIDED FLUID DRAIN BY CATHETER Received: Today Suttle, Rosanne Ashing, MD  Garth Bigness D  Approved for CT guided right lateral abdominal aspiration and possible drain placement.   [place]        Previous Messages   ----- Message -----  From: Garth Bigness D  Sent: 08/25/2020  9:23 AM EDT  To: Ir Procedure Requests  Subject: CT IMAGE GUIDED FLUID DRAIN BY CATHETER      Procedure: CT IMAGE GUIDED FLUID DRAIN BY CATHETER   Reason: Right lower quadrant abdominal pain, Abdominal wall fluid collections, fluid collection drainage - rule out abscess,  Please schedule patient ASAP for fluid collection drainage - rule out abscess. Please drain the fluid and send for culture, gram stain, cell count, etc. Thanks   History: CT in computer   Provider: Yetta Flock   Provider Contact: [phone]

## 2020-08-31 ENCOUNTER — Other Ambulatory Visit: Payer: Self-pay | Admitting: Radiology

## 2020-09-01 ENCOUNTER — Other Ambulatory Visit: Payer: Self-pay | Admitting: Gastroenterology

## 2020-09-01 ENCOUNTER — Ambulatory Visit (HOSPITAL_COMMUNITY)
Admission: RE | Admit: 2020-09-01 | Discharge: 2020-09-01 | Disposition: A | Discharge status: Home / Self Care | Payer: Managed Care, Other (non HMO) | Source: Ambulatory Visit | Attending: Interventional Radiology | Admitting: Interventional Radiology

## 2020-09-01 ENCOUNTER — Other Ambulatory Visit: Payer: Self-pay

## 2020-09-01 ENCOUNTER — Other Ambulatory Visit: Payer: Self-pay | Admitting: Radiology

## 2020-09-01 DIAGNOSIS — Z87891 Personal history of nicotine dependence: Secondary | ICD-10-CM | POA: Diagnosis not present

## 2020-09-01 DIAGNOSIS — L02211 Cutaneous abscess of abdominal wall: Secondary | ICD-10-CM | POA: Insufficient documentation

## 2020-09-01 DIAGNOSIS — Z1611 Resistance to penicillins: Secondary | ICD-10-CM | POA: Diagnosis not present

## 2020-09-01 DIAGNOSIS — R1031 Right lower quadrant pain: Secondary | ICD-10-CM

## 2020-09-01 DIAGNOSIS — Z7984 Long term (current) use of oral hypoglycemic drugs: Secondary | ICD-10-CM | POA: Insufficient documentation

## 2020-09-01 DIAGNOSIS — Z1629 Resistance to other single specified antibiotic: Secondary | ICD-10-CM | POA: Diagnosis not present

## 2020-09-01 DIAGNOSIS — K219 Gastro-esophageal reflux disease without esophagitis: Secondary | ICD-10-CM | POA: Insufficient documentation

## 2020-09-01 DIAGNOSIS — E119 Type 2 diabetes mellitus without complications: Secondary | ICD-10-CM | POA: Insufficient documentation

## 2020-09-01 DIAGNOSIS — R188 Other ascites: Secondary | ICD-10-CM

## 2020-09-01 DIAGNOSIS — Z8616 Personal history of COVID-19: Secondary | ICD-10-CM | POA: Insufficient documentation

## 2020-09-01 DIAGNOSIS — B9620 Unspecified Escherichia coli [E. coli] as the cause of diseases classified elsewhere: Secondary | ICD-10-CM | POA: Insufficient documentation

## 2020-09-01 DIAGNOSIS — Z79899 Other long term (current) drug therapy: Secondary | ICD-10-CM | POA: Insufficient documentation

## 2020-09-01 DIAGNOSIS — T8143XA Infection following a procedure, organ and space surgical site, initial encounter: Secondary | ICD-10-CM

## 2020-09-01 LAB — GLUCOSE, CAPILLARY: Glucose-Capillary: 191 mg/dL — ABNORMAL HIGH (ref 70–99)

## 2020-09-01 MED ORDER — FENTANYL CITRATE (PF) 100 MCG/2ML IJ SOLN
INTRAMUSCULAR | Status: AC | PRN
  Administered 2020-09-01: 50 ug via INTRAVENOUS

## 2020-09-01 MED ORDER — SODIUM CHLORIDE 0.9% FLUSH: 5.0000 mL | Freq: Three times a day (TID) | INTRAVENOUS | Status: DC

## 2020-09-01 MED ORDER — MIDAZOLAM HCL 2 MG/2ML IJ SOLN
INTRAMUSCULAR | Status: AC
  Filled 2020-09-01: qty 2

## 2020-09-01 MED ORDER — MIDAZOLAM HCL 2 MG/2ML IJ SOLN
INTRAMUSCULAR | Status: AC | PRN
  Administered 2020-09-01: 2 mg via INTRAVENOUS

## 2020-09-01 MED ORDER — FENTANYL CITRATE (PF) 100 MCG/2ML IJ SOLN
INTRAMUSCULAR | Status: AC
  Filled 2020-09-01: qty 2

## 2020-09-01 MED ORDER — LIDOCAINE HCL 1 % IJ SOLN
INTRAMUSCULAR | Status: AC
  Filled 2020-09-01: qty 20

## 2020-09-01 MED ORDER — SODIUM CHLORIDE 0.9 % IV SOLN: INTRAVENOUS | Status: DC

## 2020-09-01 NOTE — H&P (Signed)
Chief Complaint: RUQ abd pain. Right abdominal wall fluid collection. Request for possible aspiration drain placement.   Referring Physician(s): Armbruster,Steven P  Supervising Physician: Jacqulynn Cadet  Patient Status: [facility] - Out-pt  History of Present Illness: David Pearson is a 70 y.o. male History of iron deficiency and gastric polys, abdominal mesh hernia repair on 12.16.21 and  s/p  duodenal biopsy, duodenal polypectomy and clips for gastric polyp on 2.23.22. CT abd pelvis from 3.22.22 obtained due to persistent right sided abdominal pain. CT reads There is a fluid collection along the internal table of the right abdominal wall, underlying the oblique musculature, measuring approximately 4.9 x 2.5 x 3.8 cm. There is adjacent fluid and fat stranding about the cecal base. Findings are concerning for abscess, although of uncertain etiology. Correlate for recent surgery or other instrumentation.. Team is requesting an fluid collection aspiration possible abscess drain placement.  Currently without any significant complaints. Patient alert and laying in bed, calm and comfortable. Denies any fevers, headache, chest pain, SOB, cough,nausea, vomiting or bleeding.  Endorses right flank pain that is persistent in nature. Return precautions and treatment recommendations and follow-up discussed with the patient who is agreeable with the plan.  Past Medical History:  Diagnosis Date  . Abdominal aortic ectasia ([place]) 05/15/2018   AAA screening 05/2018, due for repeat US in 5 years 05/2023  . Cataract   . COVID-19   . Diabetes mellitus without complication (HCC)    type 2, no meds  . Diverticulosis    on colonoscopy 2020  . ED (erectile dysfunction)   . Gastric polyps   . GERD (gastroesophageal reflux disease)   . Hearing loss 02/25/2019   bilateral - [facility]  . History of iron deficiency anemia    2020.  PO iron x 1 yr.  Hemoccults 1/3 pos 12/2019 (when Hb and iron back to  normal)->GI->gastric polyps on EGD.  Marland Kitchen Hx of adenomatous polyp of colon 2020   sessile serrated polyp x 1->recall 5 yrs (Dr. Havery Moros)  . Hyperlipidemia   . Osteoarthritis of left knee    Hx of several steroid injections and these helped, meloxicam prn as of 12/2019  . Sleep apnea    does not use cpap  . Trigger thumb of left hand 2021/2022   inj x 3 by ortho    Past Surgical History:  Procedure Laterality Date  . ABDOMINAL HERNIA REPAIR  03/12/2018   abd wall hernia repair x 2  . ABDOMINOPLASTY    . BIOPSY  07/26/2020   Procedure: BIOPSY;  Surgeon: Rush Landmark Telford Nab., MD;  Location: [facility];  Service: Gastroenterology;;  . cataracts    . CHOLECYSTECTOMY N/A 03/12/2018   Procedure: LAPAROSCOPIC CHOLECYSTECTOMY;  Surgeon: Rolm Bookbinder, MD;  Location: [place];  Service: General;  Laterality: N/A;  . COLONOSCOPY  06/12/2018   sessile serrated polyp x 1, recall 2025  . ENDOSCOPIC MUCOSAL RESECTION N/A 07/26/2020   Procedure: ENDOSCOPIC MUCOSAL RESECTION;  Surgeon: Rush Landmark Telford Nab., MD;  Location: [facility];  Service: Gastroenterology;  Laterality: N/A;  . ESOPHAGOGASTRODUODENOSCOPY (EGD) WITH PROPOFOL N/A 07/26/2020   Procedure: ESOPHAGOGASTRODUODENOSCOPY (EGD) WITH PROPOFOL;  Surgeon: Rush Landmark Telford Nab., MD;  Location: [facility];  Service: Gastroenterology;  Laterality: N/A;  . EUS N/A 07/26/2020   Procedure: UPPER ENDOSCOPIC ULTRASOUND (EUS) RADIAL;  Surgeon: Rush Landmark Telford Nab., MD;  Location: [facility];  Service: Gastroenterology;  Laterality: N/A;  . HEMOSTASIS CLIP PLACEMENT  07/26/2020   Procedure: HEMOSTASIS CLIP PLACEMENT;  Surgeon: Rush Landmark,  Telford Nab., MD;  Location: [facility];  Service: Gastroenterology;;  . HERNIA REPAIR     3 inguinal hernias  . INCISIONAL HERNIA REPAIR N/A 05/18/2020   Procedure: INCISIONAL HERNIA REPAIR;  Surgeon: Rolm Bookbinder, MD;  Location: [place];  Service: General;  Laterality: N/A;  . INSERTION OF MESH  N/A 05/18/2020   Procedure: INSERTION OF MESH;  Surgeon: Rolm Bookbinder, MD;  Location: [place];  Service: General;  Laterality: N/A;  . LAPAROSCOPY N/A 05/18/2020   Procedure: LAPAROSCOPY DIAGNOSTIC;  Surgeon: Rolm Bookbinder, MD;  Location: [place];  Service: General;  Laterality: N/A;  . POLYPECTOMY  07/26/2020   gastric polypectomy. Procedure: POLYPECTOMY;  Surgeon: Irving Copas., MD;  Location: [facility];  Service: Gastroenterology;;  . Clide Deutscher  07/26/2020   Procedure: Clide Deutscher;  Surgeon: Irving Copas., MD;  Location: [facility];  Service: Gastroenterology;;  . TONSILLECTOMY     with adenoids    Allergies: Patient has no known allergies.  Medications: Prior to Admission medications   Medication Sig Start Date End Date Taking? Authorizing Provider  atorvastatin (LIPITOR) 40 MG tablet Take 1 tablet (40 mg total) by mouth daily. 03/20/20  Yes McGowen, Adrian Blackwater, MD  cholecalciferol (VITAMIN D) 1000 units tablet Take 1,000 Units by mouth daily.   Yes [provider]  ciprofloxacin (CIPRO) 500 MG tablet Take 1 tablet (500 mg total) by mouth 2 (two) times daily for 14 days. 08/23/20 09/06/20 Yes Armbruster, Carlota Raspberry, MD  metFORMIN (GLUCOPHAGE) 1000 MG tablet TAKE ONE TABLET BY MOUTH TWICE A DAY WITH A MEAL Patient taking differently: Take 1,000 mg by mouth in the morning and at bedtime. 12/15/19  Yes McGowen, Adrian Blackwater, MD  metroNIDAZOLE (FLAGYL) 500 MG tablet Take 1 tablet (500 mg total) by mouth 3 (three) times daily for 14 days. 08/23/20 09/06/20 Yes Armbruster, Carlota Raspberry, MD  Multiple Vitamin (MULTIVITAMIN WITH MINERALS) TABS tablet Take 1 tablet by mouth daily.   Yes [provider]  TRIAMCINOLONE ACETONIDE EX Apply 1 application topically 3 (three) times daily as needed (skin irritation (hands)).   Yes [provider]  Vitamin E 450 MG (1000 UT) CAPS Take 1,000 Units by mouth daily.   Yes [provider]  clobetasol cream  (TEMOVATE) 6.65 % Apply 1 application topically as needed. Patient taking differently: Apply 1 application topically 2 (two) times daily as needed (skin irritation (ears)). 03/20/20   McGowen, Adrian Blackwater, MD  omeprazole (PRILOSEC) 40 MG capsule Take 1 capsule (40 mg total) by mouth 2 (two) times daily before a meal. 07/26/20 08/25/20  Mansouraty, Telford Nab., MD  sildenafil (VIAGRA) 100 MG tablet Take 0.5-1 tablets (50-100 mg total) by mouth as needed for erectile dysfunction (for use prior to sexual activity). 03/24/19   Emeterio Reeve, DO     Family History  Problem Relation Age of Onset  . Breast cancer Maternal Aunt   . Cancer Father   . Muscular dystrophy Father   . COPD Mother   . COPD Sister   . Colon cancer Neg Hx   . Stomach cancer Neg Hx   . Rectal cancer Neg Hx   . Esophageal cancer Neg Hx   . Colon polyps Neg Hx     Social History   Socioeconomic History  . Marital status: Married    Spouse name: Not on file  . Number of children: Not on file  . Years of education: Not on file  . Highest education level: Not on file  Occupational  History  . Not on file  Tobacco Use  . Smoking status: Former Smoker    Years: 5.00    Types: Cigarettes    Quit date: 2014    Years since quitting: 8.2  . Smokeless tobacco: Never Used  Vaping Use  . Vaping Use: Never used  Substance and Sexual Activity  . Alcohol use: Yes    Alcohol/week: 4.0 standard drinks    Types: 4 Standard drinks or equivalent per week  . Drug use: Not Currently  . Sexual activity: Yes  Other Topics Concern  . Not on file  Social History Narrative   Married, one daughter and one son.   Occup: Lobbyist, retired 2021.   Tobacco:quit approx 2016, 25 pack yr hx.   Alc: social         Social Determinants of Radio broadcast assistant Strain: Not on file  Food Insecurity: Not on file  Transportation Needs: Not on file  Physical Activity: Not on file  Stress: Not on file  Social Connections:  Not on file    Review of Systems: A 12 point ROS discussed and pertinent positives are indicated in the HPI above.  All other systems are negative.  Review of Systems  Constitutional: Negative for fever.  HENT: Negative for congestion.   Respiratory: Negative for cough and shortness of breath.   Cardiovascular: Negative for chest pain.  Gastrointestinal: Positive for abdominal pain ( right flank pain). Negative for nausea and vomiting.  Neurological: Negative for headaches.  Psychiatric/Behavioral: Negative for behavioral problems and confusion.    Vital Signs: BP 120/83   Pulse 92   Temp 99.4 F (37.4 C) (Oral)   Ht 5\' 10"  (1.778 m)   Wt 225 lb (102.1 kg)   SpO2 95%   BMI 32.28 kg/m   Physical Exam Vitals and nursing note reviewed.  Constitutional:      Appearance: He is well-developed.  HENT:     Head: Normocephalic.  Cardiovascular:     Rate and Rhythm: Normal rate and regular rhythm.     Heart sounds: Normal heart sounds.  Pulmonary:     Effort: Pulmonary effort is normal.     Breath sounds: Normal breath sounds.  Musculoskeletal:        General: Normal range of motion.     Cervical back: Normal range of motion.  Skin:    General: Skin is dry.  Neurological:     Mental Status: He is alert and oriented to person, place, and time.     Imaging: CT Abdomen Pelvis W Contrast  Result Date: 08/23/2020 CLINICAL DATA:  Right lower quadrant abdominal pain EXAM: CT ABDOMEN AND PELVIS WITH CONTRAST TECHNIQUE: Multidetector CT imaging of the abdomen and pelvis was performed using the standard protocol following bolus administration of intravenous contrast. CONTRAST:  193mL OMNIPAQUE IOHEXOL 300 MG/ML SOLN, additional oral enteric contrast COMPARISON:  03/11/2018 FINDINGS: Lower chest: No acute abnormality. Hepatobiliary: No focal liver abnormality is seen. Status post cholecystectomy. No biliary dilatation. Pancreas: Unremarkable. No pancreatic ductal dilatation or  surrounding inflammatory changes. Spleen: Normal in size without significant abnormality. Adrenals/Urinary Tract: Adrenal glands are unremarkable. Kidneys are normal, without renal calculi, solid lesion, or hydronephrosis. Bladder is unremarkable. Stomach/Bowel: Stomach is within normal limits. Appendix appears normal. No evidence of bowel wall thickening, distention, or inflammatory changes. Descending and sigmoid diverticulosis. Vascular/Lymphatic: Aortic atherosclerosis. No enlarged abdominal or pelvic lymph nodes. Reproductive: No mass or other significant abnormality. Other: There is a fluid collection along  the internal table of the right abdominal wall, underlying the oblique musculature, measuring approximately 4.9 x 2.5 x 3.8 cm (series 2, image 59, series 4, image 53). There is adjacent fluid and fat stranding about the cecal base. Evidence of prior umbilical and bilateral inguinal hernia repair, overall postoperative findings of the abdomen not changed compared to prior examination dated 03/11/2018. No abdominopelvic ascites. Musculoskeletal: No acute or significant osseous findings. IMPRESSION: 1. There is a fluid collection along the internal table of the right abdominal wall, underlying the oblique musculature, measuring approximately 4.9 x 2.5 x 3.8 cm. There is adjacent fluid and fat stranding about the cecal base. Findings are concerning for abscess, although of uncertain etiology. Correlate for recent surgery or other instrumentation. 2. No cecal diverticulosis or other obvious bowel origin. The appendix is normal and in a medial lie, not closely adjacent to this collection. 3. Evidence of prior umbilical and bilateral inguinal hernia repair, overall postoperative findings of the abdomen not changed compared to prior examination dated 03/11/2018. 4. Descending and sigmoid diverticulosis without evidence of acute diverticulitis. These results will be called to the ordering clinician or  representative by the Radiologist Assistant, and communication documented in the PACS or [employer]. Aortic Atherosclerosis (ICD10-I70.0). Electronically Signed   By: Eddie Candle M.D.   On: 08/23/2020 10:02    Labs:  CBC: Recent Labs    12/15/19 1044 05/18/20 0616 08/24/20 0840  WBC 6.9 7.1 8.5  HGB 13.9 13.0 11.3*  HCT 40.5 37.2* 34.3*  PLT 200.0 262 277.0    COAGS: No results for input(s): INR, APTT in the last 8760 hours.  BMP: Recent Labs    12/15/19 1044 05/18/20 0616 08/22/20 1201  NA 139 139  --   K 5.0 3.7  --   CL 101 101  --   CO2 31 27  --   GLUCOSE 108* 187*  --   BUN 25* 14  --   CALCIUM 9.9 9.1  --   CREATININE 0.87 0.78 0.60*  GFRNONAA  --  >60  --     LIVER FUNCTION TESTS: Recent Labs    12/15/19 1044 03/20/20 0829  BILITOT 0.5 0.5  AST 22 18  ALT 28 25  ALKPHOS 70 92  PROT 6.6 6.4  ALBUMIN 4.5 4.2    Assessment and Plan:  69 y.o. male outpatient. History of iron deficiency and gastric polys, abdominal mesh hernia repair on 12.16.21 and  s/p  duodenal biopsy, duodenal polypectomy and clips for gastric polyp on 2.23.22. CT abd pelvis from 3.22.22 obtained due to persistent right sided abdominal pain. CT reads There is a fluid collection along the internal table of the right abdominal wall, underlying the oblique musculature, measuring approximately 4.9 x 2.5 x 3.8 cm. There is adjacent fluid and fat stranding about the cecal base. Findings are concerning for abscess, although of uncertain etiology. Correlate for recent surgery or other instrumentation.. Team is requesting an fluid collection aspiration possible abscess drain placement.  Labs from 3.24.22 and medications are within acceptable parameters. NKDA. Patient has been NPO since midnight.   Risks and benefits discussed with the patient including bleeding, infection, damage to adjacent structures, bowel perforation/fistula connection, and sepsis.  All of the patient's questions  were answered, patient is agreeable to proceed. Consent signed and in chart.   Thank you for this interesting consult.  I greatly enjoyed meeting Lajarvis Italiano Pearson and look forward to participating in their care.  A copy of this report was  sent to the requesting provider on this date.  Electronically Signed: Jacqualine Mau, NP 09/01/2020, 9:59 AM   I spent a total of  30 Minutes   in face to face in clinical consultation, greater than 50% of which was counseling/coordinating care for intra abdominal aspiration possible drain placement.

## 2020-09-01 NOTE — Procedures (Signed)
Interventional Radiology Procedure Note  Procedure: Aspiration of RLQ abdominal wall collection yielded frankly purulent fluid so a 35F drain was placed.  Evacuation yields 28 mL thick, purulent fluid.  Samples sent for culture.   Complications: None  Estimated Blood Loss: None  Recommendations: - Flush drain BID - F/U in IR clinic in 10 days   Signed,  Criselda Peaches, MD

## 2020-09-01 NOTE — Discharge Instructions (Signed)
  PLEASE FLUSH DRAIN DAILY WITH SALINE FLUSHES    Moderate Conscious Sedation, Adult, Care After This sheet gives you information about how to care for yourself after your procedure. Your health care provider may also give you more specific instructions. If you have problems or questions, contact your health care provider. What can I expect after the procedure? After the procedure, it is common to have:  Sleepiness for several hours.  Impaired judgment for several hours.  Difficulty with balance.  Vomiting if you eat too soon. Follow these instructions at home: For the time period you were told by your health care provider:  Rest.  Do not participate in activities where you could fall or become injured.  Do not drive or use machinery.  Do not drink alcohol.  Do not take sleeping pills or medicines that cause drowsiness.  Do not make important decisions or sign legal documents.  Do not take care of children on your own.      Eating and drinking  Follow the diet recommended by your health care provider.  Drink enough fluid to keep your urine pale yellow.  If you vomit: ? Drink water, juice, or soup when you can drink without vomiting. ? Make sure you have little or no nausea before eating solid foods.   General instructions  Take over-the-counter and prescription medicines only as told by your health care provider.  Have a responsible adult stay with you for the time you are told. It is important to have someone help care for you until you are awake and alert.  Do not smoke.  Keep all follow-up visits as told by your health care provider. This is important. Contact a health care provider if:  You are still sleepy or having trouble with balance after 24 hours.  You feel light-headed.  You keep feeling nauseous or you keep vomiting.  You develop a rash.  You have a fever.  You have redness or swelling around the IV site. Get help right away if:  You have  trouble breathing.  You have new-onset confusion at home. Summary  After the procedure, it is common to feel sleepy, have impaired judgment, or feel nauseous if you eat too soon.  Rest after you get home. Know the things you should not do after the procedure.  Follow the diet recommended by your health care provider and drink enough fluid to keep your urine pale yellow.  Get help right away if you have trouble breathing or new-onset confusion at home. This information is not intended to replace advice given to you by your health care provider. Make sure you discuss any questions you have with your health care provider. Document Revised: 09/17/2019 Document Reviewed: 04/15/2019 Elsevier Patient Education  2021 [employer].

## 2020-09-04 ENCOUNTER — Other Ambulatory Visit (HOSPITAL_COMMUNITY): Payer: Self-pay

## 2020-09-04 MED ORDER — NORMAL SALINE FLUSH 0.9 % IV SOLN
INTRAVENOUS | 0 refills | Status: DC
  Filled 2020-09-04: qty 160, 8d supply, fill #0

## 2020-09-05 ENCOUNTER — Telehealth: Payer: Self-pay | Admitting: Gastroenterology

## 2020-09-05 DIAGNOSIS — R188 Other ascites: Secondary | ICD-10-CM

## 2020-09-05 MED ORDER — CEFPODOXIME PROXETIL 200 MG PO TABS: 200.0000 mg | ORAL_TABLET | Freq: Two times a day (BID) | ORAL | 0 refills | Status: AC

## 2020-09-05 NOTE — Telephone Encounter (Signed)
Called the patient to check up on him to see how he is feeling after IR guided aspiration of fluid collection.  This appears to be an abscess, cultures taken, he was not notified of the results and I am just seeing it now, appears to grow E. coli as below.  Susceptibility   Escherichia coli    MIC    AMPICILLIN >=32 RESIST... Resistant    AMPICILLIN/SULBACTAM 8 SENSITIVE  Sensitive    CEFAZOLIN <=4 SENSITIVE  Sensitive    CEFEPIME <=0.12 SENS... Sensitive    CEFTAZIDIME <=1 SENSITIVE  Sensitive    CEFTRIAXONE <=0.25 SENS... Sensitive    CIPROFLOXACIN >=4 RESISTANT  Resistant    GENTAMICIN <=1 SENSITIVE  Sensitive    IMIPENEM <=0.25 SENS... Sensitive    PIP/TAZO <=4 SENSITIVE  Sensitive    TRIMETH/SULFA >=320 RESIS... Resistant     I had previously placed him on Cipro and Flagyl empirically, unfortunately this is resistant to Cipro.  Recommend we stop that.  Unfortunately all the other antibiotics listed in the sensitivity panel are IV or IM.  He appears to be pansensitive to cephalosporins.  I will try to place him on Cefpodoxime orally for the next 10 days or so.  He will have a repeat CT scan next week with radiology and potential drain pull pending the results.  Unclear where this abscess came from, does not appear to communicate with his colon, and appears localized to his abdominal wall.  He is otherwise feeling well and tolerating the drain placed although the drain is in general uncomfortable.  If he has any problems obtaining this antibiotic he will contact me.  Otherwise we will await his follow-up CT scan.  He agreed.  He develops fevers or recurrent pain etc. he needs to contact us.

## 2020-09-08 LAB — AEROBIC/ANAEROBIC CULTURE W GRAM STAIN (SURGICAL/DEEP WOUND): Special Requests: NORMAL

## 2020-09-13 ENCOUNTER — Ambulatory Visit
Admission: RE | Admit: 2020-09-13 | Discharge: 2020-09-13 | Disposition: A | Discharge status: Home / Self Care | Payer: Managed Care, Other (non HMO) | Source: Ambulatory Visit | Attending: Radiology | Admitting: Radiology

## 2020-09-13 ENCOUNTER — Ambulatory Visit
Admission: RE | Admit: 2020-09-13 | Discharge: 2020-09-13 | Disposition: A | Discharge status: Home / Self Care | Payer: Managed Care, Other (non HMO) | Source: Ambulatory Visit | Attending: Gastroenterology | Admitting: Gastroenterology

## 2020-09-13 ENCOUNTER — Other Ambulatory Visit: Payer: Self-pay

## 2020-09-13 ENCOUNTER — Encounter: Payer: Self-pay | Admitting: *Deleted

## 2020-09-13 DIAGNOSIS — T8143XA Infection following a procedure, organ and space surgical site, initial encounter: Secondary | ICD-10-CM

## 2020-09-13 HISTORY — PX: IR RADIOLOGIST EVAL & MGMT: IMG5224

## 2020-09-13 MED ORDER — IOPAMIDOL (ISOVUE-300) INJECTION 61%
100.0000 mL | Freq: Once | INTRAVENOUS | Status: AC | PRN
  Administered 2020-09-13: 100 mL via INTRAVENOUS

## 2020-09-13 NOTE — Progress Notes (Signed)
Referring Physician(s): Dr Doyne Keel Dr Chales Abrahams  Chief Complaint The patient is seen in follow up today s/p 4/1: Successful placement of 12 French drainage catheter into the right lower quadrant abdominal wall fluid collection   History of present illness:  right flank/abdominal wall pain, inflammation and fluid collection of uncertain etiology. He underwent placement of a percutaneous drainage catheter on 09/01/2020 revealing purulent fluid. Cultures grew E coli and Actinomyces.  Pt here today -- feeling lots better Denies pain Denies fever/chills He states OP is clear; flushin 2x/daily OP = input Here today for CT and recheck Taking Augmentin daily Follows with Dr Havery Moros and Dr Ernestine Conrad   Past Medical History:  Diagnosis Date  . Abdominal aortic ectasia ([place]) 05/15/2018   AAA screening 05/2018, due for repeat US in 5 years 05/2023  . Cataract   . COVID-19   . Diabetes mellitus without complication (HCC)    type 2, no meds  . Diverticulosis    on colonoscopy 2020  . ED (erectile dysfunction)   . Gastric polyps   . GERD (gastroesophageal reflux disease)   . Hearing loss 02/25/2019   bilateral - [facility]  . History of iron deficiency anemia    2020.  PO iron x 1 yr.  Hemoccults 1/3 pos 12/2019 (when Hb and iron back to normal)->GI->gastric polyps on EGD.  Marland Kitchen Hx of adenomatous polyp of colon 2020   sessile serrated polyp x 1->recall 5 yrs (Dr. Havery Moros)  . Hyperlipidemia   . Osteoarthritis of left knee    Hx of several steroid injections and these helped, meloxicam prn as of 12/2019  . Sleep apnea    does not use cpap  . Trigger thumb of left hand 2021/2022   inj x 3 by ortho    Past Surgical History:  Procedure Laterality Date  . ABDOMINAL HERNIA REPAIR  03/12/2018   abd wall hernia repair x 2  . ABDOMINOPLASTY    . BIOPSY  07/26/2020   Procedure: BIOPSY;  Surgeon: Rush Landmark Telford Nab., MD;  Location: [facility];  Service: Gastroenterology;;  .  cataracts    . CHOLECYSTECTOMY N/A 03/12/2018   Procedure: LAPAROSCOPIC CHOLECYSTECTOMY;  Surgeon: Rolm Bookbinder, MD;  Location: [place];  Service: General;  Laterality: N/A;  . COLONOSCOPY  06/12/2018   sessile serrated polyp x 1, recall 2025  . ENDOSCOPIC MUCOSAL RESECTION N/A 07/26/2020   Procedure: ENDOSCOPIC MUCOSAL RESECTION;  Surgeon: Rush Landmark Telford Nab., MD;  Location: [facility];  Service: Gastroenterology;  Laterality: N/A;  . ESOPHAGOGASTRODUODENOSCOPY (EGD) WITH PROPOFOL N/A 07/26/2020   Procedure: ESOPHAGOGASTRODUODENOSCOPY (EGD) WITH PROPOFOL;  Surgeon: Rush Landmark Telford Nab., MD;  Location: [facility];  Service: Gastroenterology;  Laterality: N/A;  . EUS N/A 07/26/2020   Procedure: UPPER ENDOSCOPIC ULTRASOUND (EUS) RADIAL;  Surgeon: Rush Landmark Telford Nab., MD;  Location: [facility];  Service: Gastroenterology;  Laterality: N/A;  . HEMOSTASIS CLIP PLACEMENT  07/26/2020   Procedure: HEMOSTASIS CLIP PLACEMENT;  Surgeon: Irving Copas., MD;  Location: [facility];  Service: Gastroenterology;;  . HERNIA REPAIR     3 inguinal hernias  . INCISIONAL HERNIA REPAIR N/A 05/18/2020   Procedure: INCISIONAL HERNIA REPAIR;  Surgeon: Rolm Bookbinder, MD;  Location: [facility];  Service: General;  Laterality: N/A;  . INSERTION OF MESH N/A 05/18/2020   Procedure: INSERTION OF MESH;  Surgeon: Rolm Bookbinder, MD;  Location: [place];  Service: General;  Laterality: N/A;  . LAPAROSCOPY N/A 05/18/2020   Procedure: LAPAROSCOPY DIAGNOSTIC;  Surgeon: Rolm Bookbinder, MD;  Location: [facility];  Service: General;  Laterality: N/A;  . POLYPECTOMY  07/26/2020   gastric polypectomy. Procedure: POLYPECTOMY;  Surgeon: Irving Copas., MD;  Location: [facility];  Service: Gastroenterology;;  . Clide Deutscher  07/26/2020   Procedure: Clide Deutscher;  Surgeon: Irving Copas., MD;  Location: [facility];  Service: Gastroenterology;;  . TONSILLECTOMY     with adenoids     Allergies: Patient has no known allergies.  Medications: Prior to Admission medications   Medication Sig Start Date End Date Taking? Authorizing Provider  atorvastatin (LIPITOR) 40 MG tablet Take 1 tablet (40 mg total) by mouth daily. 03/20/20   McGowen, Adrian Blackwater, MD  cefpodoxime (VANTIN) 200 MG tablet Take 1 tablet (200 mg total) by mouth 2 (two) times daily for 10 days. 09/05/20 09/15/20  Armbruster, Carlota Raspberry, MD  cholecalciferol (VITAMIN D) 1000 units tablet Take 1,000 Units by mouth daily.    [provider]  clobetasol cream (TEMOVATE) 8.34 % Apply 1 application topically as needed. Patient taking differently: Apply 1 application topically 2 (two) times daily as needed (skin irritation (ears)). 03/20/20   McGowen, Adrian Blackwater, MD  metFORMIN (GLUCOPHAGE) 1000 MG tablet TAKE ONE TABLET BY MOUTH TWICE A DAY WITH A MEAL Patient taking differently: Take 1,000 mg by mouth in the morning and at bedtime. 12/15/19   McGowen, Adrian Blackwater, MD  Multiple Vitamin (MULTIVITAMIN WITH MINERALS) TABS tablet Take 1 tablet by mouth daily.    [provider]  omeprazole (PRILOSEC) 40 MG capsule Take 1 capsule (40 mg total) by mouth 2 (two) times daily before a meal. 07/26/20 08/25/20  Mansouraty, Telford Nab., MD  sildenafil (VIAGRA) 100 MG tablet Take 0.5-1 tablets (50-100 mg total) by mouth as needed for erectile dysfunction (for use prior to sexual activity). 03/24/19   Emeterio Reeve, DO  Sodium Chloride Flush (NORMAL SALINE FLUSH) 0.9 % SOLN use 69ml to flush twice daily as directed 09/04/20     TRIAMCINOLONE ACETONIDE EX Apply 1 application topically 3 (three) times daily as needed (skin irritation (hands)).    [provider]  Vitamin E 450 MG (1000 UT) CAPS Take 1,000 Units by mouth daily.    [provider]     Family History  Problem Relation Age of Onset  . Breast cancer Maternal Aunt   . Cancer Father   . Muscular dystrophy Father   . COPD Mother   . COPD Sister    . Colon cancer Neg Hx   . Stomach cancer Neg Hx   . Rectal cancer Neg Hx   . Esophageal cancer Neg Hx   . Colon polyps Neg Hx     Social History   Socioeconomic History  . Marital status: Married    Spouse name: Not on file  . Number of children: Not on file  . Years of education: Not on file  . Highest education level: Not on file  Occupational History  . Not on file  Tobacco Use  . Smoking status: Former Smoker    Years: 5.00    Types: Cigarettes    Quit date: 2014    Years since quitting: 8.2  . Smokeless tobacco: Never Used  Vaping Use  . Vaping Use: Never used  Substance and Sexual Activity  . Alcohol use: Yes    Alcohol/week: 4.0 standard drinks    Types: 4 Standard drinks or equivalent per week  . Drug use: Not Currently  . Sexual activity: Yes  Other Topics Concern  . Not  on file  Social History Narrative   Married, one daughter and one son.   Occup: Lobbyist, retired 2021.   Tobacco:quit approx 2016, 25 pack yr hx.   Alc: social         Social Determinants of Radio broadcast assistant Strain: Not on file  Food Insecurity: Not on file  Transportation Needs: Not on file  Physical Activity: Not on file  Stress: Not on file  Social Connections: Not on file     Vital Signs: There were no vitals taken for this visit.  Physical Exam Skin:    General: Skin is warm.     Comments: Site is clean and dry NT no bleeding OP is clear cloudy fluid ~10 cc in JP  CT showing  IMPRESSION: 1. Well-positioned percutaneous drainage catheter with interval resolution of the fluid component of the previously identified abscess. No evidence of undrained fluid/abscess. There is persistent significant enhancement, thickening and surrounding inflammatory change at the site of the prior abscess affecting the right posterolateral abdominal wall. These findings are favored to reflect residual inflammation. Consider 1 additional follow-up CT scan of the abdomen  and pelvis with intravenous contrast in 3 months to ensure complete resolution and exclude the unlikely possibility of an underlying soft tissue mass. 2. Additional ancillary findings as above.     Imaging: CT ABDOMEN PELVIS W CONTRAST  Result Date: 09/13/2020 CLINICAL DATA:  70 year old male with right flank/abdominal wall pain, inflammation and fluid collection of uncertain etiology. He underwent placement of a percutaneous drainage catheter on 09/01/2020 revealing purulent fluid. Cultures grew E coli and Actinomyces. EXAM: CT ABDOMEN AND PELVIS WITH CONTRAST TECHNIQUE: Multidetector CT imaging of the abdomen and pelvis was performed using the standard protocol following bolus administration of intravenous contrast. CONTRAST:  138mL ISOVUE-300 IOPAMIDOL (ISOVUE-300) INJECTION 61% COMPARISON:  CT abdomen/pelvis 08/22/2020 FINDINGS: Lower chest: No acute abnormality. Trace atherosclerotic calcifications along the coronary arteries. Hepatobiliary: Stable small low-attenuation lesions in hepatic segments 8 and 6 without interval change dating back to October of 2019. These are almost certainly small benign cysts. The gallbladder is surgically absent. No intra or extrahepatic biliary ductal dilatation. Pancreas: Unremarkable. No pancreatic ductal dilatation or surrounding inflammatory changes. Spleen: Normal in size without focal abnormality. Adrenals/Urinary Tract: Adrenal glands are unremarkable. Kidneys are normal, without renal calculi, focal lesion, or hydronephrosis. Bladder is unremarkable. Stomach/Bowel: Colonic diverticular disease without CT evidence of active inflammation. No evidence of obstruction or focal bowel wall thickening. Normal appendix in the right lower quadrant. The terminal ileum is unremarkable. Vascular/Lymphatic: Trace atherosclerotic calcifications along the abdominal aorta. No evidence of aneurysm. No suspicious lymphadenopathy. Reproductive: Prostate is unremarkable. Other:  Surgical changes of prior mesh repair of a midline ventral hernia. No evidence of recurrence. Percutaneous drainage catheter present within the right posterolateral abdominal wall in the region of the transversus abdominus. There is residual thickening of the abdominal wall which measures up to 5.6 cm in width compared to 2.9 cm on the contralateral side. Enhancing soft tissue is present surrounding the catheter likely representing the fibrin is/granulomatous rind of the prior abscess. No evidence of fluid density to suggest undrained abscess. Inflammatory changes are also noted within the peritoneal space along the pericolic gutter. No ascites. Musculoskeletal: No acute fracture or aggressive appearing lytic or blastic osseous lesion. Chronic left L5 pars fracture. No evidence of spondylo lysis. IMPRESSION: 1. Well-positioned percutaneous drainage catheter with interval resolution of the fluid component of the previously identified abscess. No evidence of undrained  fluid/abscess. There is persistent significant enhancement, thickening and surrounding inflammatory change at the site of the prior abscess affecting the right posterolateral abdominal wall. These findings are favored to reflect residual inflammation. Consider 1 additional follow-up CT scan of the abdomen and pelvis with intravenous contrast in 3 months to ensure complete resolution and exclude the unlikely possibility of an underlying soft tissue mass. 2. Additional ancillary findings as above. Electronically Signed   By: Jacqulynn Cadet M.D.   On: 09/13/2020 12:57    Labs:  CBC: Recent Labs    12/15/19 1044 05/18/20 0616 08/24/20 0840  WBC 6.9 7.1 8.5  HGB 13.9 13.0 11.3*  HCT 40.5 37.2* 34.3*  PLT 200.0 262 277.0    COAGS: No results for input(s): INR, APTT in the last 8760 hours.  BMP: Recent Labs    12/15/19 1044 05/18/20 0616 08/22/20 1201  NA 139 139  --   K 5.0 3.7  --   CL 101 101  --   CO2 31 27  --   GLUCOSE  108* 187*  --   BUN 25* 14  --   CALCIUM 9.9 9.1  --   CREATININE 0.87 0.78 0.60*  GFRNONAA  --  >60  --     LIVER FUNCTION TESTS: Recent Labs    12/15/19 1044 03/20/20 0829  BILITOT 0.5 0.5  AST 22 18  ALT 28 25  ALKPHOS 70 92  PROT 6.6 6.4  ALBUMIN 4.5 4.2    Assessment:  Discussed with Dr Garlon Hatchet removal at bedside No issues Bandage placed Follow with Dr Havery Moros and Dr Ernestine Conrad  Signed: Lavonia Drafts, PA-C 09/13/2020, 1:03 PM   Please refer to Dr. Laurence Ferrari attestation of this note for management and plan.

## 2020-09-26 ENCOUNTER — Telehealth: Payer: Self-pay | Admitting: Gastroenterology

## 2020-09-26 NOTE — Telephone Encounter (Signed)
Spoke with patient in regards to recommendations. He is doing well and is aware that if he has any recurrence of symptoms he will need to call us and let us know. Patient is aware that we will contact him closer to July to set up repeat CT scan. Patient verbalized understanding and had no concerns at the end of the call.   Reminder in epic for repeat CT scan in 3 months.

## 2020-09-26 NOTE — Telephone Encounter (Signed)
Patient's case was discussed at multidisciplinary conference - spontaneous abscess in the right abdominal wall without clear precipitant or etiology. Does not appear to be associated with his colon in that area and his colonoscopy is up to date. Unclear etiology. Group decided repeat follow up imaging is reasonable with another CT scan in 3 months or so. If he has recurrence of symptoms in the interim he should let me know sooner.  [place] can you check in on this patient to see how he is feeling and let him know I'd like to repeat a CT Scan in late July to re-evaluate this area. If he has symptoms in the interim please let me know. Thank you

## 2020-09-26 NOTE — Telephone Encounter (Signed)
Lm on vm for patient to return call 

## 2020-09-29 ENCOUNTER — Telehealth: Payer: Self-pay

## 2020-09-29 NOTE — Telephone Encounter (Signed)
Lm on vm for patient to return call 

## 2020-09-29 NOTE — Telephone Encounter (Signed)
-----   Message from Yevette Edwards, RN sent at 08/25/2020 10:57 AM EDT ----- Regarding: Labs Repeat CBC, order in epic.

## 2020-09-29 NOTE — Telephone Encounter (Signed)
Spoke with patient to remind him that he is due for repeat labs at this time. No appointment is necessary. Patient is aware that he can stop by the lab in the basement at his convenience between 7:30 AM - 5 PM, Monday through Friday. Patient verbalized understanding and had no concerns at the end of the call.   

## 2020-09-29 NOTE — Telephone Encounter (Signed)
Patient returned call. Sent to [facility]

## 2020-10-09 ENCOUNTER — Other Ambulatory Visit (INDEPENDENT_AMBULATORY_CARE_PROVIDER_SITE_OTHER): Payer: Managed Care, Other (non HMO)

## 2020-10-09 DIAGNOSIS — D509 Iron deficiency anemia, unspecified: Secondary | ICD-10-CM | POA: Diagnosis not present

## 2020-10-09 LAB — CBC WITH DIFFERENTIAL/PLATELET
Basophils Absolute: 0.1 10*3/uL (ref 0.0–0.1)
Basophils Relative: 0.7 % (ref 0.0–3.0)
Eosinophils Absolute: 0.2 10*3/uL (ref 0.0–0.7)
Eosinophils Relative: 2.9 % (ref 0.0–5.0)
HCT: 38.8 % — ABNORMAL LOW (ref 39.0–52.0)
Hemoglobin: 12.9 g/dL — ABNORMAL LOW (ref 13.0–17.0)
Lymphocytes Relative: 24.7 % (ref 12.0–46.0)
Lymphs Abs: 1.8 10*3/uL (ref 0.7–4.0)
MCHC: 33.4 g/dL (ref 30.0–36.0)
MCV: 78.8 fl (ref 78.0–100.0)
Monocytes Absolute: 0.4 10*3/uL (ref 0.1–1.0)
Monocytes Relative: 6 % (ref 3.0–12.0)
Neutro Abs: 4.9 10*3/uL (ref 1.4–7.7)
Neutrophils Relative %: 65.7 % (ref 43.0–77.0)
Platelets: 208 10*3/uL (ref 150.0–400.0)
RBC: 4.92 Mil/uL (ref 4.22–5.81)
RDW: 19.7 % — ABNORMAL HIGH (ref 11.5–15.5)
WBC: 7.4 10*3/uL (ref 4.0–10.5)

## 2020-10-10 ENCOUNTER — Other Ambulatory Visit: Payer: Self-pay

## 2020-10-10 DIAGNOSIS — D509 Iron deficiency anemia, unspecified: Secondary | ICD-10-CM

## 2020-10-10 NOTE — Progress Notes (Signed)
Cbc due in August

## 2020-12-15 ENCOUNTER — Telehealth: Payer: Self-pay

## 2020-12-15 DIAGNOSIS — R188 Other ascites: Secondary | ICD-10-CM

## 2020-12-15 NOTE — Telephone Encounter (Signed)
Lm on vm for patient to return call 

## 2020-12-15 NOTE — Telephone Encounter (Signed)
Spoke with patient to remind him that he is due for repeat CT scan at this time. Patient states that he will proceed as long as it is covered under his insurance. Advised that our staff works on the authorization prior to the appt. Patient is aware that radiology schedulers will contact him directly to set up an appt. Advised patient that if he has not heard from them within 2 weeks to call them. I have provided patient with their phone number. Patient voiced understanding and had no concerns at the end of the call.

## 2020-12-15 NOTE — Telephone Encounter (Signed)
-----   Message from Yevette Edwards, RN sent at 09/14/2020  9:57 AM EDT ----- Regarding: CT CT abdomen pelvis w/ contrast - intra-abdominal abscess, abnormal finding on CT

## 2020-12-25 ENCOUNTER — Encounter (HOSPITAL_COMMUNITY): Payer: Self-pay

## 2020-12-25 ENCOUNTER — Ambulatory Visit (HOSPITAL_COMMUNITY)
Admission: RE | Admit: 2020-12-25 | Discharge: 2020-12-25 | Disposition: A | Discharge status: Home / Self Care | Payer: Managed Care, Other (non HMO) | Source: Ambulatory Visit | Attending: Gastroenterology | Admitting: Gastroenterology

## 2020-12-25 ENCOUNTER — Other Ambulatory Visit: Payer: Self-pay

## 2020-12-25 DIAGNOSIS — R188 Other ascites: Secondary | ICD-10-CM | POA: Insufficient documentation

## 2020-12-25 LAB — POCT I-STAT CREATININE: Creatinine, Ser: 0.8 mg/dL (ref 0.61–1.24)

## 2020-12-25 MED ORDER — IOHEXOL 350 MG/ML SOLN
100.0000 mL | Freq: Once | INTRAVENOUS | Status: AC | PRN
  Administered 2020-12-25: 80 mL via INTRAVENOUS

## 2020-12-26 ENCOUNTER — Ambulatory Visit (HOSPITAL_COMMUNITY): Admission: RE | Admit: 2020-12-26 | Payer: Managed Care, Other (non HMO) | Source: Ambulatory Visit

## 2020-12-26 ENCOUNTER — Encounter (HOSPITAL_COMMUNITY): Payer: Self-pay

## 2021-01-02 ENCOUNTER — Other Ambulatory Visit (INDEPENDENT_AMBULATORY_CARE_PROVIDER_SITE_OTHER): Payer: Managed Care, Other (non HMO)

## 2021-01-02 DIAGNOSIS — D509 Iron deficiency anemia, unspecified: Secondary | ICD-10-CM | POA: Diagnosis not present

## 2021-01-02 LAB — CBC WITH DIFFERENTIAL/PLATELET
Basophils Absolute: 0 10*3/uL (ref 0.0–0.1)
Basophils Relative: 0.6 % (ref 0.0–3.0)
Eosinophils Absolute: 0.2 10*3/uL (ref 0.0–0.7)
Eosinophils Relative: 2.5 % (ref 0.0–5.0)
HCT: 38 % — ABNORMAL LOW (ref 39.0–52.0)
Hemoglobin: 13.1 g/dL (ref 13.0–17.0)
Lymphocytes Relative: 27.5 % (ref 12.0–46.0)
Lymphs Abs: 1.7 10*3/uL (ref 0.7–4.0)
MCHC: 34.5 g/dL (ref 30.0–36.0)
MCV: 82.1 fl (ref 78.0–100.0)
Monocytes Absolute: 0.5 10*3/uL (ref 0.1–1.0)
Monocytes Relative: 7.3 % (ref 3.0–12.0)
Neutro Abs: 3.9 10*3/uL (ref 1.4–7.7)
Neutrophils Relative %: 62.1 % (ref 43.0–77.0)
Platelets: 186 10*3/uL (ref 150.0–400.0)
RBC: 4.63 Mil/uL (ref 4.22–5.81)
RDW: 15.1 % (ref 11.5–15.5)
WBC: 6.3 10*3/uL (ref 4.0–10.5)

## 2021-01-03 ENCOUNTER — Other Ambulatory Visit: Payer: Self-pay

## 2021-01-03 DIAGNOSIS — D509 Iron deficiency anemia, unspecified: Secondary | ICD-10-CM

## 2021-03-14 ENCOUNTER — Other Ambulatory Visit: Payer: Self-pay

## 2021-03-15 ENCOUNTER — Telehealth: Payer: Self-pay

## 2021-03-15 ENCOUNTER — Encounter: Payer: Self-pay | Admitting: Family Medicine

## 2021-03-15 ENCOUNTER — Other Ambulatory Visit: Payer: Self-pay

## 2021-03-15 ENCOUNTER — Ambulatory Visit (INDEPENDENT_AMBULATORY_CARE_PROVIDER_SITE_OTHER): Payer: Managed Care, Other (non HMO) | Admitting: Family Medicine

## 2021-03-15 VITALS — BP 121/82 | HR 79 | Temp 98.0°F | Resp 16 | Ht 71.0 in | Wt 241.8 lb

## 2021-03-15 DIAGNOSIS — Z862 Personal history of diseases of the blood and blood-forming organs and certain disorders involving the immune mechanism: Secondary | ICD-10-CM

## 2021-03-15 DIAGNOSIS — E7800 Pure hypercholesterolemia, unspecified: Secondary | ICD-10-CM | POA: Diagnosis not present

## 2021-03-15 DIAGNOSIS — Z125 Encounter for screening for malignant neoplasm of prostate: Secondary | ICD-10-CM | POA: Diagnosis not present

## 2021-03-15 DIAGNOSIS — E119 Type 2 diabetes mellitus without complications: Secondary | ICD-10-CM

## 2021-03-15 DIAGNOSIS — Z0000 Encounter for general adult medical examination without abnormal findings: Secondary | ICD-10-CM

## 2021-03-15 LAB — CBC WITH DIFFERENTIAL/PLATELET
Basophils Absolute: 0.1 10*3/uL (ref 0.0–0.1)
Basophils Relative: 1.8 % (ref 0.0–3.0)
Eosinophils Absolute: 0.2 10*3/uL (ref 0.0–0.7)
Eosinophils Relative: 2.5 % (ref 0.0–5.0)
HCT: 41 % (ref 39.0–52.0)
Hemoglobin: 14 g/dL (ref 13.0–17.0)
Lymphocytes Relative: 28.5 % (ref 12.0–46.0)
Lymphs Abs: 2.2 10*3/uL (ref 0.7–4.0)
MCHC: 34.2 g/dL (ref 30.0–36.0)
MCV: 84.5 fl (ref 78.0–100.0)
Monocytes Absolute: 0.5 10*3/uL (ref 0.1–1.0)
Monocytes Relative: 7 % (ref 3.0–12.0)
Neutro Abs: 4.6 10*3/uL (ref 1.4–7.7)
Neutrophils Relative %: 60.2 % (ref 43.0–77.0)
Platelets: 229 10*3/uL (ref 150.0–400.0)
RBC: 4.85 Mil/uL (ref 4.22–5.81)
RDW: 15 % (ref 11.5–15.5)
WBC: 7.6 10*3/uL (ref 4.0–10.5)

## 2021-03-15 LAB — COMPREHENSIVE METABOLIC PANEL
ALT: 30 U/L (ref 0–53)
AST: 23 U/L (ref 0–37)
Albumin: 4.4 g/dL (ref 3.5–5.2)
Alkaline Phosphatase: 76 U/L (ref 39–117)
BUN: 16 mg/dL (ref 6–23)
CO2: 30 mEq/L (ref 19–32)
Calcium: 9.7 mg/dL (ref 8.4–10.5)
Chloride: 101 mEq/L (ref 96–112)
Creatinine, Ser: 0.77 mg/dL (ref 0.40–1.50)
GFR: 91.49 mL/min (ref >60.00)
Glucose, Bld: 125 mg/dL — ABNORMAL HIGH (ref 70–99)
Potassium: 4.2 mEq/L (ref 3.5–5.1)
Sodium: 139 mEq/L (ref 135–145)
Total Bilirubin: 0.7 mg/dL (ref 0.2–1.2)
Total Protein: 6.7 g/dL (ref 6.0–8.3)

## 2021-03-15 LAB — LIPID PANEL
Cholesterol: 144 mg/dL (ref 0–200)
HDL: 39.2 mg/dL (ref >39.00)
NonHDL: 104.96
Total CHOL/HDL Ratio: 4
Triglycerides: 201 mg/dL — ABNORMAL HIGH (ref 0.0–149.0)
VLDL: 40.2 mg/dL — ABNORMAL HIGH (ref 0.0–40.0)

## 2021-03-15 LAB — LDL CHOLESTEROL, DIRECT: Direct LDL: 79 mg/dL

## 2021-03-15 LAB — HEMOGLOBIN A1C: Hgb A1c MFr Bld: 7 % — ABNORMAL HIGH (ref 4.6–6.5)

## 2021-03-15 LAB — PSA, MEDICARE: PSA: 0.7 ng/ml (ref 0.10–4.00)

## 2021-03-15 MED ORDER — METFORMIN HCL 1000 MG PO TABS: ORAL_TABLET | ORAL | 3 refills | Status: DC

## 2021-03-15 MED ORDER — IRON (FERROUS SULFATE) 325 (65 FE) MG PO TABS: 325.0000 mg | ORAL_TABLET | Freq: Every day | ORAL | 1 refills | Status: DC

## 2021-03-15 MED ORDER — OMEPRAZOLE 40 MG PO CPDR: 40.0000 mg | DELAYED_RELEASE_CAPSULE | Freq: Two times a day (BID) | ORAL | 0 refills | Status: DC

## 2021-03-15 MED ORDER — CLOBETASOL PROPIONATE 0.05 % EX CREA: 1.0000 "application " | TOPICAL_CREAM | CUTANEOUS | 1 refills | Status: DC | PRN

## 2021-03-15 MED ORDER — ATORVASTATIN CALCIUM 40 MG PO TABS: 40.0000 mg | ORAL_TABLET | Freq: Every day | ORAL | 3 refills | Status: DC

## 2021-03-15 NOTE — Telephone Encounter (Signed)
Patient was seen today by Dr. Anitra Lauth. 1 of his prescriptions did not get sent in with all the others.  [place]  omeprazole (PRILOSEC) 40 MG capsule [[id number]

## 2021-03-15 NOTE — Patient Instructions (Signed)
Health Maintenance, Male Adopting a healthy lifestyle and getting preventive care are important in promoting health and wellness. Ask your health care provider about: The right schedule for you to have regular tests and exams. Things you can do on your own to prevent diseases and keep yourself healthy. What should I know about diet, weight, and exercise? Eat a healthy diet  Eat a diet that includes plenty of vegetables, fruits, low-fat dairy products, and lean protein. Do not eat a lot of foods that are high in solid fats, added sugars, or sodium. Maintain a healthy weight Body mass index (BMI) is a measurement that can be used to identify possible weight problems. It estimates body fat based on height and weight. Your health care provider can help determine your BMI and help you achieve or maintain a healthy weight. Get regular exercise Get regular exercise. This is one of the most important things you can do for your health. Most adults should: Exercise for at least 150 minutes each week. The exercise should increase your heart rate and make you sweat (moderate-intensity exercise). Do strengthening exercises at least twice a week. This is in addition to the moderate-intensity exercise. Spend less time sitting. Even light physical activity can be beneficial. Watch cholesterol and blood lipids Have your blood tested for lipids and cholesterol at 69 years of age, then have this test every 5 years. You may need to have your cholesterol levels checked more often if: Your lipid or cholesterol levels are high. You are older than 69 years of age. You are at high risk for heart disease. What should I know about cancer screening? Many types of cancers can be detected early and may often be prevented. Depending on your health history and family history, you may need to have cancer screening at various ages. This may include screening for: Colorectal cancer. Prostate cancer. Skin cancer. Lung  cancer. What should I know about heart disease, diabetes, and high blood pressure? Blood pressure and heart disease High blood pressure causes heart disease and increases the risk of stroke. This is more likely to develop in people who have high blood pressure readings, are of African descent, or are overweight. Talk with your health care provider about your target blood pressure readings. Have your blood pressure checked: Every 3-5 years if you are 18-39 years of age. Every year if you are 40 years old or older. If you are between the ages of 65 and 75 and are a current or former smoker, ask your health care provider if you should have a one-time screening for abdominal aortic aneurysm (AAA). Diabetes Have regular diabetes screenings. This checks your fasting blood sugar level. Have the screening done: Once every three years after age 45 if you are at a normal weight and have a low risk for diabetes. More often and at a younger age if you are overweight or have a high risk for diabetes. What should I know about preventing infection? Hepatitis B If you have a higher risk for hepatitis B, you should be screened for this virus. Talk with your health care provider to find out if you are at risk for hepatitis B infection. Hepatitis C Blood testing is recommended for: Everyone born from 1945 through 1965. Anyone with known risk factors for hepatitis C. Sexually transmitted infections (STIs) You should be screened each year for STIs, including gonorrhea and chlamydia, if: You are sexually active and are younger than 69 years of age. You are older than 69 years   of age and your health care provider tells you that you are at risk for this type of infection. Your sexual activity has changed since you were last screened, and you are at increased risk for chlamydia or gonorrhea. Ask your health care provider if you are at risk. Ask your health care provider about whether you are at high risk for HIV.  Your health care provider may recommend a prescription medicine to help prevent HIV infection. If you choose to take medicine to prevent HIV, you should first get tested for HIV. You should then be tested every 3 months for as long as you are taking the medicine. Follow these instructions at home: Lifestyle Do not use any products that contain nicotine or tobacco, such as cigarettes, e-cigarettes, and chewing tobacco. If you need help quitting, ask your health care provider. Do not use street drugs. Do not share needles. Ask your health care provider for help if you need support or information about quitting drugs. Alcohol use Do not drink alcohol if your health care provider tells you not to drink. If you drink alcohol: Limit how much you have to 0-2 drinks a day. Be aware of how much alcohol is in your drink. In the [place] one drink equals one 12 oz bottle of beer (355 mL), one 5 oz glass of wine (148 mL), or one 1 oz glass of hard liquor (44 mL). General instructions Schedule regular health, dental, and eye exams. Stay current with your vaccines. Tell your health care provider if: You often feel depressed. You have ever been abused or do not feel safe at home. Summary Adopting a healthy lifestyle and getting preventive care are important in promoting health and wellness. Follow your health care provider's instructions about healthy diet, exercising, and getting tested or screened for diseases. Follow your health care provider's instructions on monitoring your cholesterol and blood pressure. This information is not intended to replace advice given to you by your health care provider. Make sure you discuss any questions you have with your health care provider. Document Revised: 07/28/2020 Document Reviewed: 05/13/2018 Elsevier Patient Education  2022 [employer]  

## 2021-03-15 NOTE — Telephone Encounter (Signed)
Pt advised refill sent. °

## 2021-03-15 NOTE — Progress Notes (Signed)
Office Note 03/15/2021  CC: cpe and f/u chronic illnesses   HPI:  Patient is a 70 y.o. male who is here for annual health maintenance exam and f/u DM 2, HLD, hx of IDA. I last saw him for his establish care/cpe visit about 15 months ago. A/P as of last visit: "Hx of IDA--has been on iron tab qd for 1 yr. Hemoccults x 3 ordered, repeat CBC with iron panel.  Continue iron tab for now.   Left thumb stenosing tenosynovitis, catching/triggering-->refer to Dr. Amedeo Plenty, hand specialist, today.   DM/prediabetes: Hba1c today. Says he's UTD on eye exam. We'll do urine microalb/cr and feet exam at next f/u visit in 6 mo.   HLD: simvastatin for "years". FLP and hepatic panel today.   Umbilical hernia: hx of repair in the past but it has recurred and bothers him some and he requests referral to Dr. Donne Hazel.  Pt states Dr. Donne Hazel did his umbil hernia repair and cholecystectomy in 2019.  Referral ordered today.   Health maintenance exam: Reviewed age and gender appropriate health maintenance issues (prudent diet, regular exercise, health risks of tobacco and excessive alcohol, use of seatbelts, fire alarms in home, use of sunscreen).  Also reviewed age and gender appropriate health screening as well as vaccine recommendations. Vaccines: Tdap given today.  He declined pneumovax.  He has had covid vaccine but doesn't recall the dates. Labs: CMET, CBC, FLP, iron panel (hx of IDA), PSA, HbA1c. Prostate ca screening: DRE normal, PSA drawn today. Colon ca screening: UTD, recall 2025."  INTERIM HX: He got abdominal incisional hernia repair #2021. He then got upper GI evaluation for his known diagnosis of iron deficiency anemia with heme positive stool.  He was found to have a gastric polyp and this was excised. He then had right lower quadrant abdominal pain that was evaluated by gastroenterologist and CT scan found a right lower abdominal abscess.  A drain was placed.  12/25/2020 CT abdomen  showed no residual abscess.  Taking ferrous sulfate 325 qd for the last 1-76mo.  He is feeling well.  Eating and drinking well.  No abdominal pain.  No melena or hematochezia.  Energy level is good.  Not exercising, though. He takes metformin 1000 mg in the morning but says he often forgets the evening dose.  He takes a atorvastatin 40 mg every day.  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  PO iron x 1 yr.  Hemoccults 1/3 pos 12/2019 (when Hb and iron back to normal)->GI->gastric polyps on EGD.   Hx of adenomatous polyp of colon 2020   sessile serrated polyp x 1->recall 5 yrs (Dr. Havery Moros)   Hyperlipidemia    Osteoarthritis of left knee    Hx of several steroid injections and these helped, meloxicam prn as of 12/2019   Sleep apnea    does not use cpap   Trigger thumb of left hand 2021/2022   inj x 3 by ortho    Past Surgical  History:  Procedure Laterality Date   ABDOMINAL HERNIA REPAIR  03/12/2018   abd wall hernia repair x 2   ABDOMINOPLASTY     BIOPSY  07/26/2020   Procedure: BIOPSY;  Surgeon: Irving Copas., MD;  Location: [facility];  Service: Gastroenterology;;   cataracts     CHOLECYSTECTOMY N/A 03/12/2018   Procedure: LAPAROSCOPIC CHOLECYSTECTOMY;  Surgeon: Rolm Bookbinder, MD;  Location: [place];  Service: General;  Laterality: N/A;   COLONOSCOPY  06/12/2018   sessile serrated polyp x 1, recall 2025   ENDOSCOPIC MUCOSAL RESECTION N/A 07/26/2020   Procedure: ENDOSCOPIC MUCOSAL RESECTION;  Surgeon: Irving Copas., MD;  Location: [facility];  Service: Gastroenterology;  Laterality: N/A;   ESOPHAGOGASTRODUODENOSCOPY (EGD) WITH PROPOFOL N/A 07/26/2020   Procedure: ESOPHAGOGASTRODUODENOSCOPY (EGD) WITH PROPOFOL;  Surgeon: Rush Landmark Telford Nab., MD;  Location: [facility];  Service: Gastroenterology;  Laterality: N/A;   EUS N/A 07/26/2020   Procedure: UPPER ENDOSCOPIC ULTRASOUND (EUS) RADIAL;  Surgeon: Irving Copas., MD;  Location: [facility];  Service: Gastroenterology;  Laterality: N/A;   HEMOSTASIS CLIP PLACEMENT  07/26/2020   Procedure: HEMOSTASIS CLIP PLACEMENT;  Surgeon: Rush Landmark Telford Nab., MD;  Location: [facility];  Service: Gastroenterology;;   HERNIA REPAIR     3 inguinal hernias   INCISIONAL HERNIA REPAIR N/A 05/18/2020   Procedure: INCISIONAL HERNIA REPAIR;  Surgeon: Rolm Bookbinder, MD;  Location: [place];  Service: General;  Laterality: N/A;   INSERTION OF MESH N/A 05/18/2020   Procedure: INSERTION OF MESH;  Surgeon: Rolm Bookbinder, MD;  Location: [place];  Service: General;  Laterality: N/A;   IR RADIOLOGIST EVAL & MGMT  09/13/2020   LAPAROSCOPY N/A 05/18/2020   Procedure: LAPAROSCOPY DIAGNOSTIC;  Surgeon: Rolm Bookbinder, MD;  Location: [place];  Service: General;  Laterality: N/A;   POLYPECTOMY  07/26/2020   gastric polypectomy. Procedure: POLYPECTOMY;  Surgeon: Mansouraty, Telford Nab., MD;  Location: [facility];  Service: Gastroenterology;;   Clide Deutscher  07/26/2020   Procedure: Clide Deutscher;  Surgeon: Mansouraty, Telford Nab., MD;  Location: [facility];  Service: Gastroenterology;;   TONSILLECTOMY     with adenoids    Family History  Problem Relation Age of Onset   Breast cancer Maternal Aunt    Cancer Father    Muscular dystrophy Father    COPD Mother    COPD Sister    Colon cancer Neg Hx    Stomach cancer Neg Hx    Rectal cancer Neg Hx    Esophageal cancer Neg Hx    Colon polyps Neg Hx     Social History   Socioeconomic History   Marital status: Married     Spouse name: Not on file   Number of children: Not on file   Years of education: Not on file   Highest education level: Not on file  Occupational History   Not on file  Tobacco Use   Smoking status: Former    Years: 5.00    Types: Cigarettes    Quit date: 2014    Years since quitting: 8.7   Smokeless tobacco: Never  Vaping Use   Vaping Use: Never used  Substance and Sexual Activity   Alcohol use: Yes    Alcohol/week: 4.0 standard drinks    Types: 4 Standard drinks or equivalent per week   Drug use: Not Currently   Sexual activity: Yes  Other Topics Concern   Not on file  Social History Narrative   Married, one daughter and one son.   Occup:  Lobbyist, retired 2021.   Tobacco:quit approx 2016, 25 pack yr hx.   Alc: social         Social Determinants of Radio broadcast assistant Strain: Not on file  Food Insecurity: Not on file  Transportation Needs: Not on file  Physical Activity: Not on file  Stress: Not on file  Social Connections: Not on file  Intimate Partner Violence: Not on file    Outpatient Medications Prior to Visit  Medication Sig Dispense Refill   cholecalciferol (VITAMIN D) 1000 units tablet Take 1,000 Units by mouth daily.     Multiple Vitamin (MULTIVITAMIN WITH MINERALS) TABS tablet Take 1 tablet by mouth daily.     sildenafil (VIAGRA) 100 MG tablet Take 0.5-1 tablets (50-100 mg total) by mouth as needed for erectile dysfunction (for use prior to sexual activity). 30 tablet 3   TRIAMCINOLONE ACETONIDE EX Apply 1 application topically 3 (three) times daily as needed (skin irritation (hands)).     Vitamin E 450 MG (1000 UT) CAPS Take 1,000 Units by mouth daily.     omeprazole (PRILOSEC) 40 MG capsule Take 1 capsule (40 mg total) by mouth 2 (two) times daily before a meal. 60 capsule 1   atorvastatin (LIPITOR) 40 MG tablet Take 1 tablet (40 mg total) by mouth daily. 90 tablet 3   clobetasol cream (TEMOVATE) 3.53 % Apply 1 application topically as  needed. (Patient taking differently: Apply 1 application topically 2 (two) times daily as needed (skin irritation (ears)).) 30 g 1   metFORMIN (GLUCOPHAGE) 1000 MG tablet TAKE ONE TABLET BY MOUTH TWICE A DAY WITH A MEAL (Patient taking differently: Take 1,000 mg by mouth in the morning and at bedtime.) 180 tablet 3   No facility-administered medications prior to visit.    No Known Allergies  ROS Review of Systems  Constitutional:  Negative for appetite change, chills, fatigue and fever.  HENT:  Negative for congestion, dental problem, ear pain and sore throat.   Eyes:  Negative for discharge, redness and visual disturbance.  Respiratory:  Negative for cough, chest tightness, shortness of breath and wheezing.   Cardiovascular:  Negative for chest pain, palpitations and leg swelling.  Gastrointestinal:  Negative for abdominal pain, blood in stool, diarrhea, nausea and vomiting.  Genitourinary:  Negative for difficulty urinating, dysuria, flank pain, frequency, hematuria and urgency.  Musculoskeletal:  Negative for arthralgias, back pain, joint swelling, myalgias and neck stiffness.  Skin:  Negative for pallor and rash.  Neurological:  Negative for dizziness, speech difficulty, weakness and headaches.  Hematological:  Negative for adenopathy. Does not bruise/bleed easily.  Psychiatric/Behavioral:  Negative for confusion and sleep disturbance. The patient is not nervous/anxious.    PE; Vitals with BMI 03/15/2021 09/01/2020 09/01/2020  Height 5\' 11"  - -  Weight 241 lbs 13 oz - -  BMI 61.44 - -  Systolic 315 400 867  Diastolic 82 97 83  Pulse 79 82 88   Gen: Alert, well appearing.  Patient is oriented to person, place, time, and situation. AFFECT: pleasant, lucid thought and speech. ENT: Ears: EACs clear, normal epithelium.  TMs with good light reflex and landmarks bilaterally.  Eyes: no injection, icteris, swelling, or exudate.  EOMI, PERRLA. Nose: no drainage or turbinate edema/swelling.   No injection or focal lesion.  Mouth: lips without lesion/swelling.  Oral mucosa pink and moist.  Dentition intact and without obvious caries or gingival swelling.  Oropharynx without erythema, exudate, or swelling.  Neck: supple/nontender.  No LAD,  mass, or TM.  Carotid pulses 2+ bilaterally, without bruits. CV: RRR, no m/r/g.   LUNGS: CTA bilat, nonlabored resps, good aeration in all lung fields. ABD: soft, NT, ND, BS normal.  No hepatospenomegaly or mass.  No bruits. EXT: no clubbing, cyanosis, or edema.  Musculoskeletal: no joint swelling, erythema, warmth, or tenderness.  ROM of all joints intact. Skin - no sores or suspicious lesions or rashes or color changes Foot exam -  no swelling, tenderness or skin or vascular lesions. Color and temperature is normal. Sensation is intact. Peripheral pulses are palpable. Toenails are normal.  Pertinent labs:  No results found for: TSH Lab Results  Component Value Date   WBC 6.3 01/02/2021   HGB 13.1 01/02/2021   HCT 38.0 (L) 01/02/2021   MCV 82.1 01/02/2021   PLT 186.0 01/02/2021   Lab Results  Component Value Date   IRON 102 12/15/2019   TIBC 392 12/15/2019   FERRITIN 37 12/15/2019   Lab Results  Component Value Date   CREATININE 0.80 12/25/2020   BUN 14 05/18/2020   NA 139 05/18/2020   K 3.7 05/18/2020   CL 101 05/18/2020   CO2 27 05/18/2020   Lab Results  Component Value Date   ALT 25 03/20/2020   AST 18 03/20/2020   ALKPHOS 92 03/20/2020   BILITOT 0.5 03/20/2020   Lab Results  Component Value Date   CHOL 139 03/20/2020   Lab Results  Component Value Date   HDL 39.30 03/20/2020   Lab Results  Component Value Date   LDLCALC 76 03/20/2020   Lab Results  Component Value Date   TRIG 118.0 03/20/2020   Lab Results  Component Value Date   CHOLHDL 4 03/20/2020   Lab Results  Component Value Date   PSA 0.61 12/15/2019   Lab Results  Component Value Date   HGBA1C 6.4 12/15/2019   ASSESSMENT AND PLAN:   1)  DM: Not quite fully compliant with his metformin.  Feet exam normal today.  Globin A1c and urine microalbumin today.  2) HLD: Tolerating atorvastatin 40 mg/day long-term. Lipid panel and hepatic panel today.  3) Hx of IDA d/t upper gi bleed (gastric polyp): has been on oral iron for about 41mo. No recent sign of bleeding. CBC, iron panel today.  Rx for FeSO4 325 qd.  4) Health maintenance exam: Reviewed age and gender appropriate health maintenance issues (prudent diet, regular exercise, health risks of tobacco and excessive alcohol, use of seatbelts, fire alarms in home, use of sunscreen).  Also reviewed age and gender appropriate health screening as well as vaccine recommendations. Vaccines: Prevnar 20->declines.  Flu->declines.  Shingrix->declines. Labs: CMET, CBC, FLP, iron panel (hx of IDA), PSA, HbA1c. Prostate ca screening: PSA drawn today. Colon ca screening: UTD, recall 06/2021."   An After Visit Summary was printed and given to the patient.  FOLLOW UP:  Return in about 6 months (around 09/13/2021) for routine chronic illness f/u.  Signed:  Crissie Sickles, MD           03/15/2021

## 2021-03-16 LAB — IRON,TIBC AND FERRITIN PANEL
%SAT: 36 % (calc) (ref 20–48)
Ferritin: 48 ng/mL (ref 24–380)
Iron: 131 ug/dL (ref 50–180)
TIBC: 366 mcg/dL (calc) (ref 250–425)

## 2021-04-02 IMAGING — CT CT ABD-PELV W/ CM
2 of 5 series · 15 of 46 positions shown, 17 images · IV contrast (omnipaque)
Comparison: 03/11/2018

CLINICAL DATA: Right lower quadrant abdominal pain

EXAM:
CT ABDOMEN AND PELVIS WITH CONTRAST
TECHNIQUE: Multidetector CT imaging of the abdomen and pelvis was performed
using the standard protocol following bolus administration of
intravenous contrast.
CONTRAST:  100mL OMNIPAQUE IOHEXOL 300 MG/ML SOLN, additional oral
enteric contrast

[Series 2: axial st · axial · 0.90mm/px · z∈[-615,-135]mm · 12 of 114 slices shown, 14 images]
[im 9/114  soft-tissue]
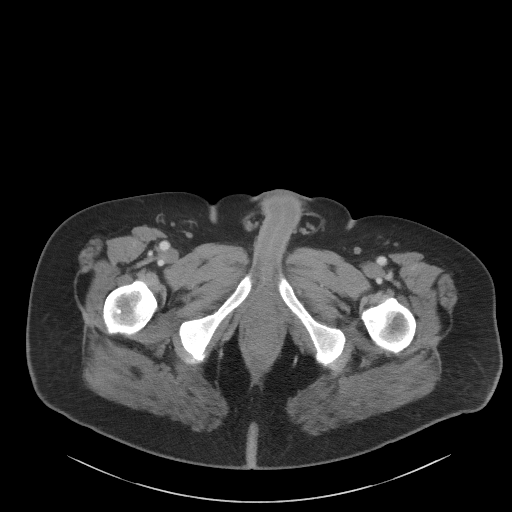
[im 9/114  bone]
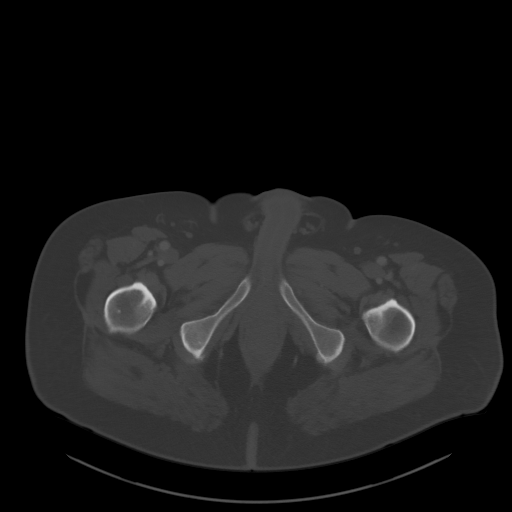
[im 17/114  soft-tissue]
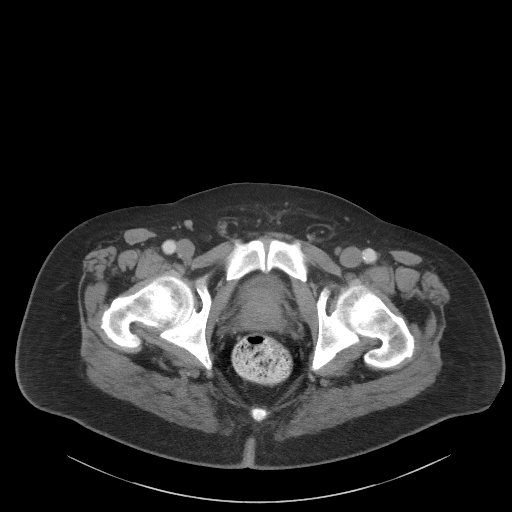
[im 25/114  soft-tissue]
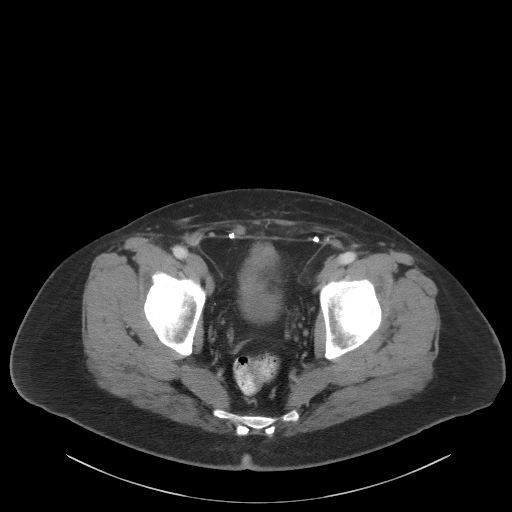
[im 33/114  soft-tissue]
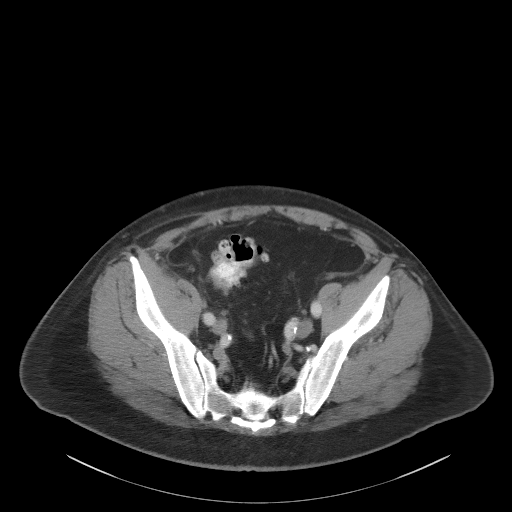
[im 41/114  soft-tissue]
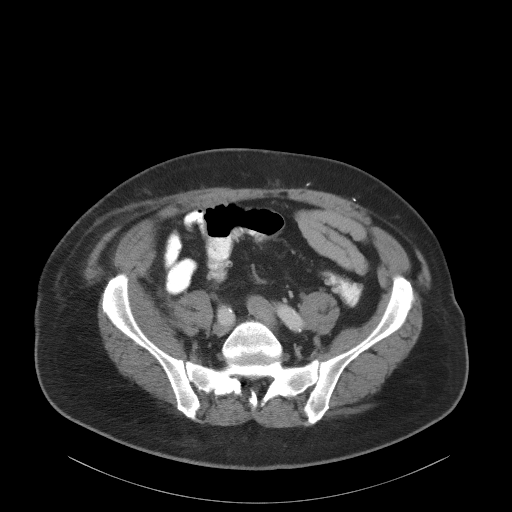
[im 49/114  soft-tissue]
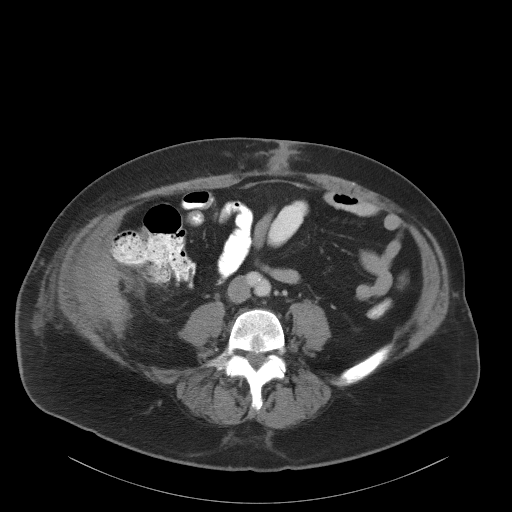
[im 65/114  soft-tissue]
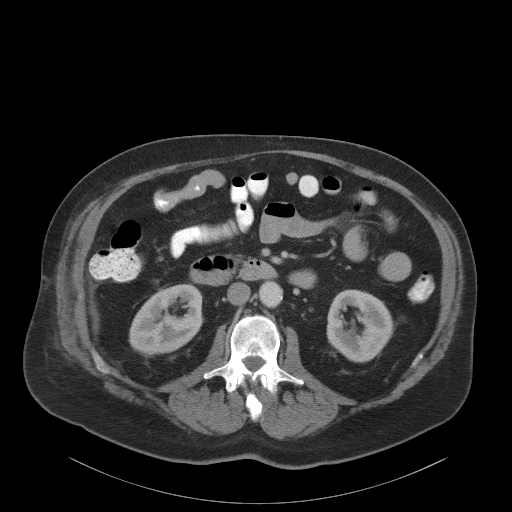
[im 73/114  soft-tissue]
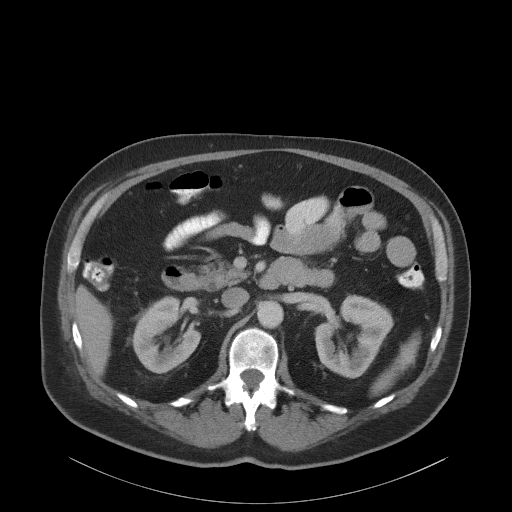
[im 81/114  soft-tissue]
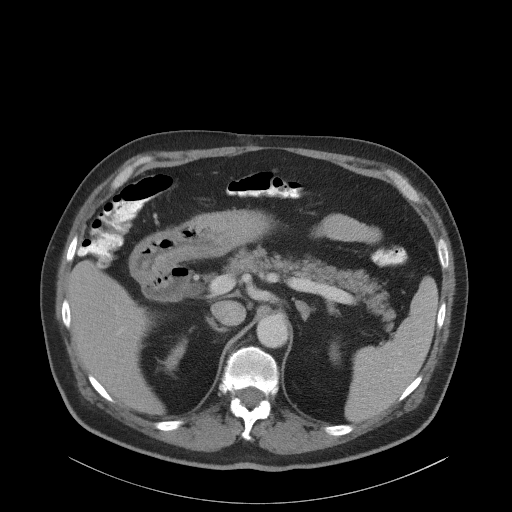
[im 81/114  bone]
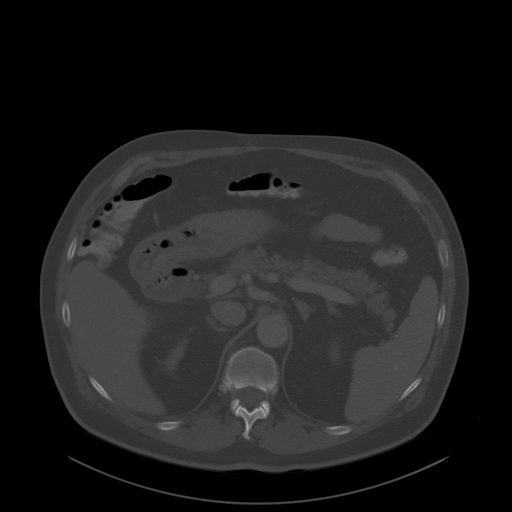
[im 89/114  soft-tissue]
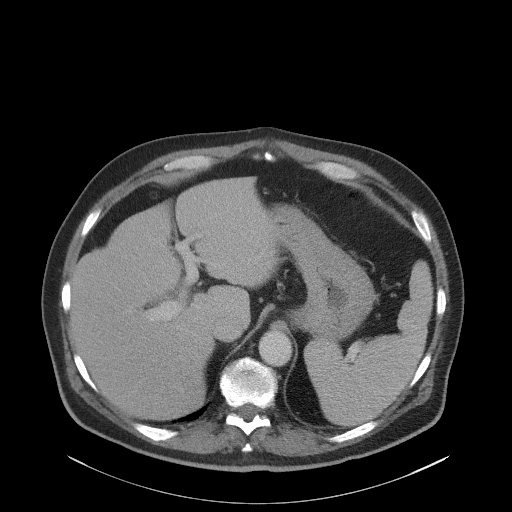
[im 97/114  soft-tissue]
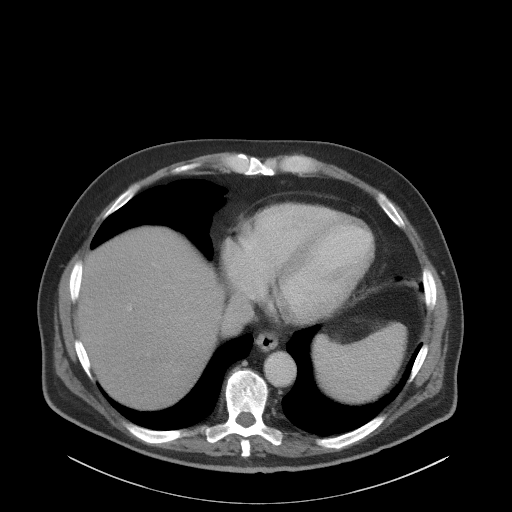
[im 105/114  soft-tissue]
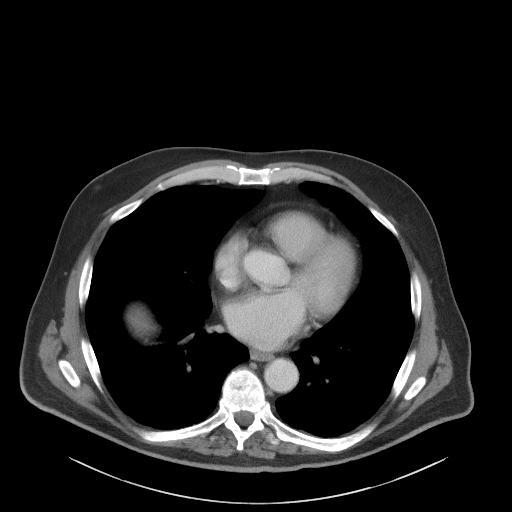

[Series 4: coronal st · coronal · 0.95mm/px · 3 of 95 slices shown]
[im 32/95  soft-tissue]
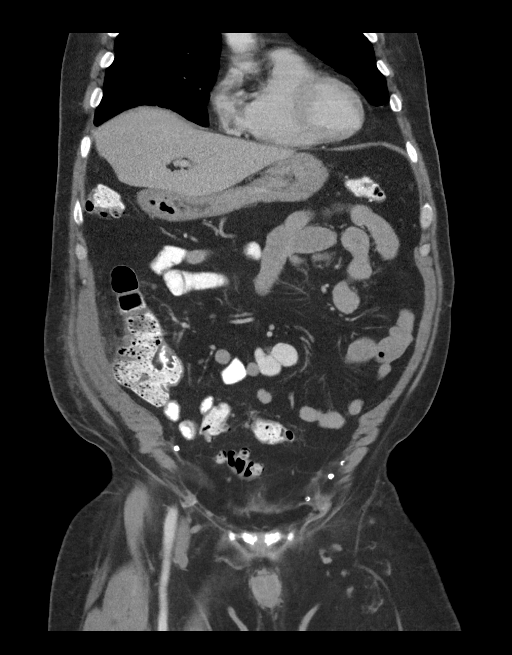
[im 42/95  soft-tissue]
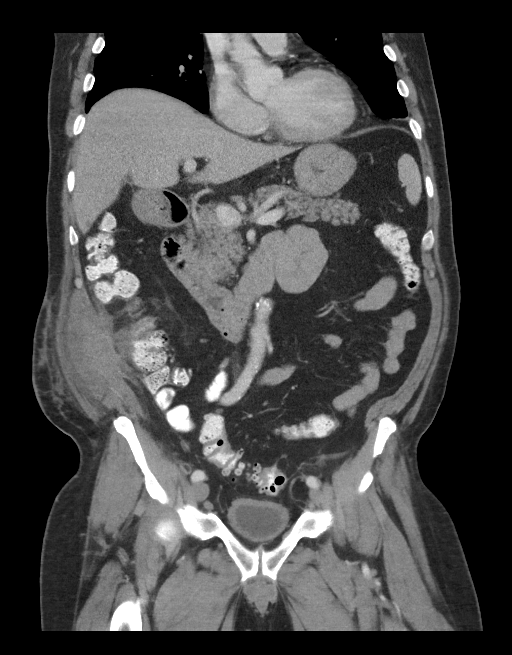
[im 53/95  soft-tissue]
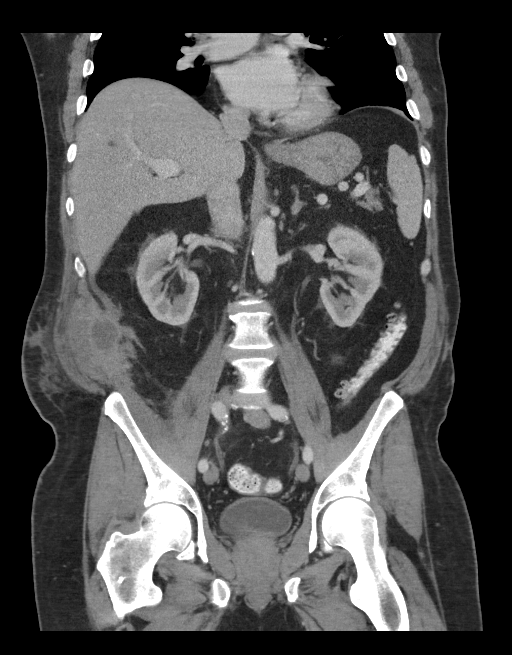

[15 of 46 positions shown; findings below may reference images not displayed]

FINDINGS: Lower chest: No acute abnormality.

Hepatobiliary: No focal liver abnormality is seen. Status post
cholecystectomy. No biliary dilatation.

Pancreas: Unremarkable. No pancreatic ductal dilatation or
surrounding inflammatory changes.

Spleen: Normal in size without significant abnormality.

Adrenals/Urinary Tract: Adrenal glands are unremarkable. Kidneys are
normal, without renal calculi, solid lesion, or hydronephrosis.
Bladder is unremarkable.

Stomach/Bowel: Stomach is within normal limits. Appendix appears
normal. No evidence of bowel wall thickening, distention, or
inflammatory changes. Descending and sigmoid diverticulosis.

Vascular/Lymphatic: Aortic atherosclerosis. No enlarged abdominal or
pelvic lymph nodes.

Reproductive: No mass or other significant abnormality.

Other: There is a fluid collection along the internal table of the
right abdominal wall, underlying the oblique musculature, measuring
approximately 4.9 x 2.5 x 3.8 cm (series 2, image 59, series 4,
image 53). There is adjacent fluid and fat stranding about the cecal
base. Evidence of prior umbilical and bilateral inguinal hernia
repair, overall postoperative findings of the abdomen not changed
compared to prior examination dated 03/11/2018. No abdominopelvic
ascites.

Musculoskeletal: No acute or significant osseous findings.
IMPRESSION: 1. There is a fluid collection along the internal table of the right
abdominal wall, underlying the oblique musculature, measuring
approximately 4.9 x 2.5 x 3.8 cm. There is adjacent fluid and fat
stranding about the cecal base. Findings are concerning for abscess,
although of uncertain etiology. Correlate for recent surgery or
other instrumentation.
2. No cecal diverticulosis or other obvious bowel origin. The
appendix is normal and in a medial lie, not closely adjacent to this
collection.
3. Evidence of prior umbilical and bilateral inguinal hernia repair,
overall postoperative findings of the abdomen not changed compared
to prior examination dated 03/11/2018.
[DATE]. Descending and sigmoid diverticulosis without evidence of acute
diverticulitis.

These results will be called to the ordering clinician or
representative by the Radiologist Assistant, and communication
documented in the PACS or [REDACTED].

Aortic Atherosclerosis ([id number]).

## 2021-04-17 NOTE — Telephone Encounter (Signed)
MyChart message sent to pt

## 2021-04-17 NOTE — Telephone Encounter (Signed)
-----   Message from Yetta Flock, MD sent at 04/17/2021 12:59 PM EST ----- Regarding: RE: cbc due late Nov Thanks Jan.  Can you help book a routine follow up in the office with me? Thanks  ----- Message ----- From: Roetta Sessions, CMA Sent: 04/16/2021   3:03 PM EST To: Yetta Flock, MD Subject: Melton Alar: cbc due late Nov                           FYI - patient had CBC on 10-13.  Hgb of 14.0  Please advise. Thanks. Jan  ----- Message ----- From: Roetta Sessions, CMA Sent: 04/16/2021  12:00 AM EST To: Roetta Sessions, CMA Subject: cbc due late Nov                               Patient due for CBC in late November

## 2021-06-12 ENCOUNTER — Ambulatory Visit: Payer: Managed Care, Other (non HMO) | Admitting: Gastroenterology

## 2021-07-30 ENCOUNTER — Encounter: Payer: Self-pay | Admitting: Gastroenterology

## 2021-12-31 ENCOUNTER — Ambulatory Visit (INDEPENDENT_AMBULATORY_CARE_PROVIDER_SITE_OTHER): Payer: Managed Care, Other (non HMO) | Admitting: Family Medicine

## 2021-12-31 VITALS — BP 138/75 | HR 89 | Temp 97.6°F | Ht 71.0 in | Wt 239.2 lb

## 2021-12-31 DIAGNOSIS — J069 Acute upper respiratory infection, unspecified: Secondary | ICD-10-CM

## 2021-12-31 DIAGNOSIS — J180 Bronchopneumonia, unspecified organism: Secondary | ICD-10-CM

## 2021-12-31 MED ORDER — BENZONATATE 200 MG PO CAPS: 200.0000 mg | ORAL_CAPSULE | Freq: Two times a day (BID) | ORAL | 0 refills | Status: DC | PRN

## 2021-12-31 MED ORDER — HYDROCODONE BIT-HOMATROP MBR 5-1.5 MG/5ML PO SOLN: 5.0000 mL | Freq: Three times a day (TID) | ORAL | 0 refills | Status: DC | PRN

## 2021-12-31 MED ORDER — DOXYCYCLINE HYCLATE 100 MG PO CAPS: 100.0000 mg | ORAL_CAPSULE | Freq: Two times a day (BID) | ORAL | 0 refills | Status: AC

## 2021-12-31 NOTE — Progress Notes (Signed)
OFFICE VISIT  12/31/2021  CC:  Chief Complaint  Patient presents with   Cough    Productive; phlegm    Runny nose   Patient is a 71 y.o. male who presents for runny nose and cough.  HPI: Onset 5 to 6 days ago, significant cough and runny nose.  He had some subjective fever the first couple of days.  The cough does keep him up at night quite a bit.  Delsym had no effect. Denies chest pain, shortness of breath, wheezing, or body aches.   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  PO iron x 1 yr.  Hemoccults 1/3 pos 12/2019 (when Hb and iron back to normal)->GI->gastric polyps on EGD.   Hx of adenomatous polyp of colon 2020   sessile serrated polyp x 1->recall 5 yrs (Dr. Havery Moros)   Hyperlipidemia    Intra-abdominal abscess post-procedure 2022   not long after having incisional hernia repair-->abscess drained   Osteoarthritis of left knee    Hx of several steroid injections and these helped, meloxicam prn as of 12/2019   Sleep apnea    does not use cpap   Trigger thumb of left hand 2021/2022   inj x 3 by ortho    Past Surgical History:  Procedure Laterality Date   ABDOMINAL HERNIA REPAIR  03/12/2018   abd wall hernia repair x 2   ABDOMINOPLASTY     BIOPSY  07/26/2020   Procedure: BIOPSY;  Surgeon: Irving Copas., MD;  Location: [facility];  Service: Gastroenterology;;   cataracts     CHOLECYSTECTOMY N/A 03/12/2018   Procedure: LAPAROSCOPIC CHOLECYSTECTOMY;  Surgeon: Rolm Bookbinder, MD;  Location: [place];  Service: General;  Laterality: N/A;    COLONOSCOPY  06/12/2018   sessile serrated polyp x 1, recall 2025   ENDOSCOPIC MUCOSAL RESECTION N/A 07/26/2020   Procedure: ENDOSCOPIC MUCOSAL RESECTION;  Surgeon: Irving Copas., MD;  Location: [facility];  Service: Gastroenterology;  Laterality: N/A;   ESOPHAGOGASTRODUODENOSCOPY (EGD) WITH PROPOFOL N/A 07/26/2020   Procedure: ESOPHAGOGASTRODUODENOSCOPY (EGD) WITH PROPOFOL;  Surgeon: Rush Landmark Telford Nab., MD;  [facility]: [facility];  Service: Gastroenterology;  Laterality: N/A;   EUS N/A 07/26/2020   Procedure: UPPER ENDOSCOPIC ULTRASOUND (EUS) RADIAL;  Surgeon: Irving Copas., MD;  Location: [facility];  Service: Gastroenterology;  Laterality: N/A;   HEMOSTASIS CLIP PLACEMENT  07/26/2020   Procedure: HEMOSTASIS CLIP PLACEMENT;  Surgeon: Irving Copas., MD;  Location: [facility];  Service: Gastroenterology;;   HERNIA REPAIR     3 inguinal hernias   INCISIONAL HERNIA REPAIR N/A 05/18/2020   Procedure: INCISIONAL HERNIA REPAIR;  Surgeon: Rolm Bookbinder, MD;  Location: [place];  Service: General;  Laterality: N/A;   INSERTION OF MESH N/A 05/18/2020   Procedure: INSERTION OF MESH;  Surgeon: Rolm Bookbinder, MD;  Location: [place];  Service: General;  Laterality: N/A;   IR RADIOLOGIST EVAL & MGMT  09/13/2020   LAPAROSCOPY N/A 05/18/2020   Procedure: LAPAROSCOPY DIAGNOSTIC;  Surgeon:  Rolm Bookbinder, MD;  Location: [place];  Service: General;  Laterality: N/A;   POLYPECTOMY  07/26/2020   gastric polypectomy. Procedure: POLYPECTOMY;  Surgeon: Rush Landmark Telford Nab., MD;  Location: [facility];  Service: Gastroenterology;;   Clide Deutscher  07/26/2020   Procedure: Clide Deutscher;  Surgeon: Irving Copas., MD;  Location: [facility];  Service: Gastroenterology;;   TONSILLECTOMY     with adenoids    Outpatient Medications Prior to Visit  Medication Sig Dispense Refill   atorvastatin (LIPITOR) 40 MG tablet Take 1 tablet (40 mg total) by mouth daily.  90 tablet 3   cholecalciferol (VITAMIN D) 1000 units tablet Take 1,000 Units by mouth daily.     clobetasol cream (TEMOVATE) 4.40 % Apply 1 application topically as needed. 30 g 1   Iron, Ferrous Sulfate, 325 (65 Fe) MG TABS Take 325 mg by mouth daily. 90 tablet 1   metFORMIN (GLUCOPHAGE) 1000 MG tablet TAKE ONE TABLET BY MOUTH TWICE A DAY WITH A MEAL 180 tablet 3   Multiple Vitamin (MULTIVITAMIN WITH MINERALS) TABS tablet Take 1 tablet by mouth daily.     sildenafil (VIAGRA) 100 MG tablet Take 0.5-1 tablets (50-100 mg total) by mouth as needed for erectile dysfunction (for use prior to sexual activity). 30 tablet 3   TRIAMCINOLONE ACETONIDE EX Apply 1 application topically 3 (three) times daily as needed (skin irritation (hands)).     Vitamin E 450 MG (1000 UT) CAPS Take 1,000 Units by mouth daily.     omeprazole (PRILOSEC) 40 MG capsule Take 1 capsule (40 mg total) by mouth 2 (two) times daily before a meal. 180 capsule 0   No facility-administered medications prior to visit.    No Known Allergies  ROS As per HPI  PE:    12/31/2021    4:11 PM 03/15/2021    8:06 AM 09/01/2020   12:30 PM  Vitals with BMI  Height '5\' 11"'$  '5\' 11"'$    Weight 239 lbs 3 oz 241 lbs 13 oz   BMI 10.27 25.36   Systolic 644 034 742  Diastolic 75 82 97  Pulse 89 79 82     Physical Exam  VS: noted--normal. Gen: alert, NAD, NONTOXIC APPEARING. HEENT: eyes without injection, drainage, or swelling.  Ears: EACs clear, TMs with normal light reflex and landmarks.  Nose: Clear rhinorrhea, with some dried, crusty exudate adherent to mildly injected mucosa.  No purulent d/c.  No paranasal sinus TTP.  No facial swelling.  Throat and mouth without focal lesion.  No pharyngial swelling, erythema, or exudate.   Neck: supple, no LAD.   LUNGS: Left base with soft inspiratory crackles that partially clear with coughing.  Subtle decreased breath sounds left base.  Otherwise lungs clear.  Breathing is nonlabored. CV: RRR, no  m/r/g. EXT: no c/c/e SKIN: no rash   LABS:  Last CBC Lab Results  Component Value Date   WBC 7.6 03/15/2021   HGB 14.0 03/15/2021   HCT 41.0 03/15/2021   MCV 84.5 03/15/2021   MCH 29.1 05/18/2020   RDW 15.0 03/15/2021   PLT 229.0 03/15/2021   Lab Results  Component Value Date   IRON 131 03/15/2021   TIBC 366 03/15/2021   FERRITIN 48 59/56/3875   Last metabolic panel Lab Results  Component Value Date   GLUCOSE 125 (H) 03/15/2021   NA 139 03/15/2021   K 4.2 03/15/2021   CL 101 03/15/2021   CO2 30 03/15/2021   BUN 16 03/15/2021   CREATININE 0.77 03/15/2021  GFRNONAA >60 05/18/2020   CALCIUM 9.7 03/15/2021   PROT 6.7 03/15/2021   ALBUMIN 4.4 03/15/2021   BILITOT 0.7 03/15/2021   ALKPHOS 76 03/15/2021   AST 23 03/15/2021   ALT 30 03/15/2021   ANIONGAP 11 05/18/2020   Lab Results  Component Value Date   HGBA1C 7.0 (H) 03/15/2021   IMPRESSION AND PLAN:  URI with cough/congestion. Exam findings consistent with possible bronchopneumonia. Doxycycline 100 mg twice daily x7 days prescribed. Additionally, he prescribed Tessalon [place] 200 mg twice daily as needed cough during the day and I did prescribe Hycodan syrup to take 1 to 2 teaspoons every 8 hours as needed, #120 mils.  Discussed possible COVID test in the office today and patient declined.  An After Visit Summary was printed and given to the patient.  FOLLOW UP: No follow-ups on file.  Signed:  Crissie Sickles, MD           12/31/2021

## 2022-01-11 ENCOUNTER — Other Ambulatory Visit: Payer: Self-pay | Admitting: Family Medicine

## 2022-01-11 MED ORDER — HYDROCODONE BIT-HOMATROP MBR 5-1.5 MG/5ML PO SOLN
5.0000 mL | Freq: Three times a day (TID) | ORAL | 0 refills | Status: DC | PRN
Start: 2022-01-11 — End: 2022-05-30

## 2022-01-11 MED ORDER — BENZONATATE 200 MG PO CAPS
200.0000 mg | ORAL_CAPSULE | Freq: Two times a day (BID) | ORAL | 0 refills | Status: DC | PRN
Start: 2022-01-11 — End: 2022-05-30

## 2022-01-11 NOTE — Telephone Encounter (Addendum)
Pt advised refill sent and to follow up if medications needed after this. He said [facility] did not have cough syrup. Followed up with pharmacy, medication has to be ordered. Pt made aware

## 2022-01-11 NOTE — Telephone Encounter (Signed)
Pt still c/o coughing while sleeping. Last visit 12/31/21   Please review and advise

## 2022-01-11 NOTE — Telephone Encounter (Signed)
Okay, prescriptions have been authorized.  However if he continues to need this medication after this he will need to come back for follow-up of his condition.

## 2022-03-12 ENCOUNTER — Encounter: Payer: Self-pay | Admitting: Family Medicine

## 2022-03-15 ENCOUNTER — Encounter: Payer: Self-pay | Admitting: Family Medicine

## 2022-03-15 ENCOUNTER — Ambulatory Visit (INDEPENDENT_AMBULATORY_CARE_PROVIDER_SITE_OTHER): Payer: Managed Care, Other (non HMO) | Admitting: Family Medicine

## 2022-03-15 VITALS — BP 126/83 | HR 87 | Temp 97.9°F | Ht 71.0 in | Wt 242.2 lb

## 2022-03-15 DIAGNOSIS — A048 Other specified bacterial intestinal infections: Secondary | ICD-10-CM | POA: Diagnosis not present

## 2022-03-15 MED ORDER — AMOXICILLIN 500 MG PO TABS: ORAL_TABLET | ORAL | 0 refills | Status: DC

## 2022-03-15 MED ORDER — CLARITHROMYCIN 500 MG PO TABS: 500.0000 mg | ORAL_TABLET | Freq: Two times a day (BID) | ORAL | 0 refills | Status: AC

## 2022-03-15 NOTE — Progress Notes (Signed)
OFFICE VISIT  05/16/2022  CC:  Chief Complaint  Patient presents with   Evaluation for H. Pylori    Denies nausea, stomach pain, loss of appetite. Wife recently dx with H.Pylori and being treated with 2 different abx   Patient is a 71 y.o. male who presents for questions about H. pylori.  HPI: Patient states he feels well. He says his wife has recently tested positive on a breath test for H. pylori.  He wonders if he should get treated in order to decrease risk of transmission back to her if he is the source.  Hx of gastric polyps, h pylori NEG (07/2021).  Past Medical History:  Diagnosis Date   Abdominal aortic ectasia ([place]) 05/15/2018   AAA screening 05/2018, due for repeat US in 5 years 05/2023   Cataract    COVID-19    Diabetes mellitus without complication ([place])    type 2, no meds   Diverticulosis    on colonoscopy 2020   ED (erectile dysfunction)    Gastric polyps    GERD (gastroesophageal reflux disease)    Hearing loss 02/25/2019   bilateral - [facility]   History of iron deficiency anemia    2020.  PO iron x 1 yr.  Hemoccults 1/3 pos 12/2019 (when Hb and iron back to normal)->GI->gastric polyps on EGD.   Hx of adenomatous polyp of colon 2020   sessile serrated polyp x 1->recall 5 yrs (Dr. Havery Moros)   Hyperlipidemia    Intra-abdominal abscess post-procedure 2022   not long after having incisional hernia repair-->abscess drained   Osteoarthritis of left knee    Hx of several steroid injections and these helped, meloxicam prn as of 12/2019   Sleep apnea    does not use cpap   Trigger thumb of left hand 2021/2022   inj x 3 by ortho    Past Surgical History:  Procedure Laterality Date   ABDOMINAL HERNIA REPAIR  03/12/2018   abd wall hernia repair x 2   ABDOMINOPLASTY     BIOPSY  07/26/2020   Procedure: BIOPSY;  Surgeon: Irving Copas., MD;  Location: [facility];  Service: Gastroenterology;;   cataracts     CHOLECYSTECTOMY N/A 03/12/2018    Procedure: LAPAROSCOPIC CHOLECYSTECTOMY;  Surgeon: Rolm Bookbinder, MD;  Location: [place];  Service: General;  Laterality: N/A;   COLONOSCOPY  06/12/2018   sessile serrated polyp x 1, recall 2025   ENDOSCOPIC MUCOSAL RESECTION N/A 07/26/2020   Procedure: ENDOSCOPIC MUCOSAL RESECTION;  Surgeon: Irving Copas., MD;  Location: [facility];  Service: Gastroenterology;  Laterality: N/A;   ESOPHAGOGASTRODUODENOSCOPY (EGD) WITH PROPOFOL N/A 07/26/2020   Procedure: ESOPHAGOGASTRODUODENOSCOPY (EGD) WITH PROPOFOL;  Surgeon: Rush Landmark Telford Nab., MD;  [facility];  Service: Gastroenterology;  Laterality: N/A;   EUS N/A 07/26/2020   Procedure: UPPER ENDOSCOPIC ULTRASOUND (EUS) RADIAL;  Surgeon: Irving Copas., MD;  [facility];  Service: Gastroenterology;  Laterality: N/A;   HEMOSTASIS CLIP PLACEMENT  07/26/2020   Procedure: HEMOSTASIS CLIP PLACEMENT;  Surgeon: Irving Copas., MD;  Location: [facility];  Service: Gastroenterology;;   HERNIA REPAIR     3 inguinal hernias   INCISIONAL HERNIA REPAIR N/A 05/18/2020   Procedure: INCISIONAL HERNIA REPAIR;  Surgeon: Rolm Bookbinder, MD;  Location: [place];  Service: General;  Laterality: N/A;   INSERTION OF MESH N/A 05/18/2020   Procedure: INSERTION OF MESH;  Surgeon: Rolm Bookbinder, MD;  Location: [place];  Service: General;  Laterality: N/A;   IR RADIOLOGIST  EVAL & MGMT  09/13/2020   LAPAROSCOPY N/A 05/18/2020   Procedure: LAPAROSCOPY DIAGNOSTIC;  Surgeon: Rolm Bookbinder, MD;  Location: [place];  Service: General;  Laterality: N/A;   POLYPECTOMY  07/26/2020   gastric polypectomy. Procedure: POLYPECTOMY;  Surgeon: Rush Landmark Telford Nab., MD;  Location: [facility];  Service: Gastroenterology;;   Clide Deutscher  07/26/2020   Procedure: Clide Deutscher;  Surgeon: Irving Copas., MD;  Location: [facility];  Service: Gastroenterology;;   TONSILLECTOMY     with adenoids    Outpatient Medications  Prior to Visit  Medication Sig Dispense Refill   atorvastatin (LIPITOR) 40 MG tablet Take 1 tablet (40 mg total) by mouth daily. (Patient not taking: Reported on 03/15/2022) 90 tablet 3   benzonatate (TESSALON) 200 MG capsule Take 1 capsule (200 mg total) by mouth 2 (two) times daily as needed for cough. (Patient not taking: Reported on 03/15/2022) 20 capsule 0   cholecalciferol (VITAMIN D) 1000 units tablet Take 1,000 Units by mouth daily. (Patient not taking: Reported on 03/15/2022)     clobetasol cream (TEMOVATE) 8.89 % Apply 1 application topically as needed. (Patient not taking: Reported on 03/15/2022) 30 g 1   HYDROcodone bit-homatropine (HYCODAN) 5-1.5 MG/5ML syrup Take 5 mLs by mouth every 8 (eight) hours as needed for cough. (Patient not taking: Reported on 03/15/2022) 120 mL 0   Iron, Ferrous Sulfate, 325 (65 Fe) MG TABS Take 325 mg by mouth daily. (Patient not taking: Reported on 03/15/2022) 90 tablet 1   metFORMIN (GLUCOPHAGE) 1000 MG tablet TAKE ONE TABLET BY MOUTH TWICE A DAY WITH A MEAL (Patient not taking: Reported on 03/15/2022) 180 tablet 3   Multiple Vitamin (MULTIVITAMIN WITH MINERALS) TABS tablet Take 1 tablet by mouth daily. (Patient not taking: Reported on 03/15/2022)     omeprazole (PRILOSEC) 40 MG capsule Take 1 capsule (40 mg total) by mouth 2 (two) times daily before a meal. 180 capsule 0   sildenafil (VIAGRA) 100 MG tablet Take 0.5-1 tablets (50-100 mg total) by mouth as needed for erectile dysfunction (for use prior to sexual activity). (Patient not taking: Reported on 03/15/2022) 30 tablet 3   TRIAMCINOLONE ACETONIDE EX Apply 1 application topically 3 (three) times daily as needed (skin irritation (hands)). (Patient not taking: Reported on 03/15/2022)     Vitamin E 450 MG (1000 UT) CAPS Take 1,000 Units by mouth daily. (Patient not taking: Reported on 03/15/2022)     No facility-administered medications prior to visit.    No Known Allergies  ROS As per  HPI  PE:    03/15/2022    3:40 PM 12/31/2021    4:11 PM 03/15/2021    8:06 AM  Vitals with BMI  Height '5\' 11"'$  '5\' 11"'$  '5\' 11"'$   Weight 242 lbs 3 oz 239 lbs 3 oz 241 lbs 13 oz  BMI 33.79 16.94 50.38  Systolic 882 800 349  Diastolic 83 75 82  Pulse 87 89 79     Physical Exam  Gen: Alert, well appearing.  Patient is oriented to person, place, time, and situation. AFFECT: pleasant, lucid thought and speech. No further exam today.  LABS:  Last CBC Lab Results  Component Value Date   WBC 7.6 03/15/2021   HGB 14.0 03/15/2021   HCT 41.0 03/15/2021   MCV 84.5 03/15/2021   MCH 29.1 05/18/2020   RDW 15.0 03/15/2021   PLT 229.0 17/91/5056   Last metabolic panel Lab Results  Component Value Date   GLUCOSE 125 (H) 03/15/2021   NA  139 03/15/2021   K 4.2 03/15/2021   CL 101 03/15/2021   CO2 30 03/15/2021   BUN 16 03/15/2021   CREATININE 0.77 03/15/2021   GFRNONAA >60 05/18/2020   CALCIUM 9.7 03/15/2021   PROT 6.7 03/15/2021   ALBUMIN 4.4 03/15/2021   BILITOT 0.7 03/15/2021   ALKPHOS 76 03/15/2021   AST 23 03/15/2021   ALT 30 03/15/2021   ANIONGAP 11 05/18/2020   IMPRESSION AND PLAN:  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 I told patient I really did not know if treating him with antibiotics empirically when he is asymptomatic would make any difference in whether his wife got any recurrence of H. pylori. He states his wife really wants him to be treated. We discussed the risk versus benefits of antibiotics and he wanted to proceed. Prescribed Amoxil 1 g twice daily x 14 days, clarithromycin 500 mg twice daily x 14 days, omeprazole 40 mg a day x 14 days, and Pepto 4 times daily x 14 days.  An After Visit Summary was printed and given to the patient.  FOLLOW UP: Return for as needed.  Signed:  Crissie Sickles, MD           05/16/2022

## 2022-05-29 ENCOUNTER — Telehealth: Payer: Self-pay | Admitting: Family Medicine

## 2022-05-29 NOTE — Telephone Encounter (Signed)
Patient advised that for treatment he would need to be seen. Offered appt for tomorrow morning, pt agreed.

## 2022-05-29 NOTE — Telephone Encounter (Signed)
Pt calls and report congestion and cough. I informed him that we were booked today and that the next available was Friday. He declined that appointment and the suggestion of Urgent care or another [place] office. He then proceeds to ask can you just ask Dr. Anitra Lauth if he will call something in for my cold and that he is scheduled to leave  town and have had something planned for a while. Please contact patient to advise.

## 2022-05-30 ENCOUNTER — Ambulatory Visit: Payer: Managed Care, Other (non HMO) | Admitting: Family Medicine

## 2022-05-30 ENCOUNTER — Encounter: Payer: Self-pay | Admitting: Family Medicine

## 2022-05-30 VITALS — BP 121/78 | HR 108 | Temp 99.3°F | Ht 71.0 in | Wt 243.4 lb

## 2022-05-30 DIAGNOSIS — R051 Acute cough: Secondary | ICD-10-CM | POA: Diagnosis not present

## 2022-05-30 DIAGNOSIS — J111 Influenza due to unidentified influenza virus with other respiratory manifestations: Secondary | ICD-10-CM | POA: Diagnosis not present

## 2022-05-30 LAB — POCT INFLUENZA A/B
Influenza A, POC: NEGATIVE
Influenza B, POC: NEGATIVE

## 2022-05-30 MED ORDER — HYDROCODONE BIT-HOMATROP MBR 5-1.5 MG/5ML PO SOLN: ORAL | 0 refills | Status: DC

## 2022-05-30 NOTE — Progress Notes (Signed)
OFFICE VISIT  05/30/2022  CC:  Chief Complaint  Patient presents with   Cough    Pt c/o fever with chills, sweats, diarrhea, fatigue and nasal congestion. Unsure if cough is productive. Wife gave him otc cough medicine. Denies at home testing for covid and declined option for swab in office. Pt was swabbed for flu   Patient is a 71 y.o. male who presents for cough and congestion.  HPI: Onset 4 days ago of nasal congestion, cough, subjective fever and chills, decreased taste, and diarrhea.  No abdominal pain.  He has not checked his temperature.  He feels no shortness of breath but is quite fatigued.  No nausea or vomiting.  No wheezing or chest pain.  Past Medical History:  Diagnosis Date   Abdominal aortic ectasia ([place]) 05/15/2018   AAA screening 05/2018, due for repeat US in 5 years 05/2023   Cataract    COVID-19    Diabetes mellitus without complication ([place])    type 2, no meds   Diverticulosis    on colonoscopy 2020   ED (erectile dysfunction)    Gastric polyps    GERD (gastroesophageal reflux disease)    Hearing loss 02/25/2019   bilateral - [facility]   History of iron deficiency anemia    2020.  PO iron x 1 yr.  Hemoccults 1/3 pos 12/2019 (when Hb and iron back to normal)->GI->gastric polyps on EGD.   Hx of adenomatous polyp of colon 2020   sessile serrated polyp x 1->recall 5 yrs (Dr. Havery Moros)   Hyperlipidemia    Intra-abdominal abscess post-procedure 2022   not long after having incisional hernia repair-->abscess drained   Osteoarthritis of left knee    Hx of several steroid injections and these helped, meloxicam prn as of 12/2019   Sleep apnea    does not use cpap   Trigger thumb of left hand 2021/2022   inj x 3 by ortho    Past Surgical History:  Procedure Laterality Date   ABDOMINAL HERNIA REPAIR  03/12/2018   abd wall hernia repair x 2   ABDOMINOPLASTY     BIOPSY  07/26/2020   Procedure: BIOPSY;  Surgeon: Irving Copas., MD;  Location: [facility];  Service: Gastroenterology;;   cataracts     CHOLECYSTECTOMY N/A 03/12/2018   Procedure: LAPAROSCOPIC CHOLECYSTECTOMY;  Surgeon: Rolm Bookbinder, MD;  Location: [place];  Service: General;  Laterality: N/A;   COLONOSCOPY  06/12/2018   sessile serrated polyp x 1, recall 2025   ENDOSCOPIC MUCOSAL RESECTION N/A 07/26/2020   Procedure: ENDOSCOPIC MUCOSAL RESECTION;  Surgeon: Irving Copas., MD;  Location: [facility];  Service: Gastroenterology;  Laterality: N/A;   ESOPHAGOGASTRODUODENOSCOPY (EGD) WITH PROPOFOL N/A 07/26/2020   Procedure: ESOPHAGOGASTRODUODENOSCOPY (EGD) WITH PROPOFOL;  Surgeon: Rush Landmark Telford Nab., MD;  [facility];  Service: Gastroenterology;  Laterality: N/A;   EUS N/A 07/26/2020   Procedure: UPPER ENDOSCOPIC ULTRASOUND (EUS) RADIAL;  Surgeon: Irving Copas., MD;  [facility];  Service: Gastroenterology;  Laterality: N/A;   HEMOSTASIS CLIP PLACEMENT  07/26/2020   Procedure: HEMOSTASIS CLIP PLACEMENT;  Surgeon: Irving Copas., MD;  Location: [facility];  Service: Gastroenterology;;   HERNIA REPAIR     3 inguinal hernias   INCISIONAL HERNIA REPAIR N/A 05/18/2020   Procedure: INCISIONAL HERNIA REPAIR;  Surgeon: Rolm Bookbinder, MD;  Location: [place];  Service: General;  Laterality: N/A;   INSERTION OF MESH N/A 05/18/2020   Procedure: INSERTION OF MESH;  Surgeon: Rolm Bookbinder, MD;  Location: [facility];  Service: General;  Laterality: N/A;   IR RADIOLOGIST EVAL & MGMT  09/13/2020   LAPAROSCOPY N/A 05/18/2020   Procedure: LAPAROSCOPY DIAGNOSTIC;  Surgeon: Rolm Bookbinder, MD;  Location: [place];  Service: General;  Laterality: N/A;   POLYPECTOMY  07/26/2020   gastric polypectomy. Procedure: POLYPECTOMY;  Surgeon: Mansouraty, Telford Nab., MD;  Location: [facility];  Service: Gastroenterology;;   Clide Deutscher  07/26/2020   Procedure: Clide Deutscher;  Surgeon: Irving Copas., MD;  Location: [facility];   Service: Gastroenterology;;   TONSILLECTOMY     with adenoids    Outpatient Medications Prior to Visit  Medication Sig Dispense Refill   amoxicillin (AMOXIL) 500 MG tablet 2 tabs po bid x 14 days 56 tablet 0   atorvastatin (LIPITOR) 40 MG tablet Take 1 tablet (40 mg total) by mouth daily. (Patient not taking: Reported on 03/15/2022) 90 tablet 3   benzonatate (TESSALON) 200 MG capsule Take 1 capsule (200 mg total) by mouth 2 (two) times daily as needed for cough. (Patient not taking: Reported on 03/15/2022) 20 capsule 0   cholecalciferol (VITAMIN D) 1000 units tablet Take 1,000 Units by mouth daily. (Patient not taking: Reported on 03/15/2022)     clobetasol cream (TEMOVATE) 8.52 % Apply 1 application topically as needed. (Patient not taking: Reported on 03/15/2022) 30 g 1   HYDROcodone bit-homatropine (HYCODAN) 5-1.5 MG/5ML syrup Take 5 mLs by mouth every 8 (eight) hours as needed for cough. (Patient not taking: Reported on 03/15/2022) 120 mL 0   Iron, Ferrous Sulfate, 325 (65 Fe) MG TABS Take 325 mg by mouth daily. (Patient not taking: Reported on 03/15/2022) 90 tablet 1   metFORMIN (GLUCOPHAGE) 1000 MG tablet TAKE ONE TABLET BY MOUTH TWICE A DAY WITH A MEAL (Patient not taking: Reported on 03/15/2022) 180 tablet 3   Multiple Vitamin (MULTIVITAMIN WITH MINERALS) TABS tablet Take 1 tablet by mouth daily. (Patient not taking: Reported on 03/15/2022)     omeprazole (PRILOSEC) 40 MG capsule Take 1 capsule (40 mg total) by mouth 2 (two) times daily before a meal. 180 capsule 0   sildenafil (VIAGRA) 100 MG tablet Take 0.5-1 tablets (50-100 mg total) by mouth as needed for erectile dysfunction (for use prior to sexual activity). (Patient not taking: Reported on 03/15/2022) 30 tablet 3   TRIAMCINOLONE ACETONIDE EX Apply 1 application topically 3 (three) times daily as needed (skin irritation (hands)). (Patient not taking: Reported on 03/15/2022)     Vitamin E 450 MG (1000 UT) CAPS Take 1,000 Units by  mouth daily. (Patient not taking: Reported on 03/15/2022)     No facility-administered medications prior to visit.    No Known Allergies  Review of Systems  As per HPI  PE:    05/30/2022    8:36 AM 03/15/2022    3:40 PM 12/31/2021    4:11 PM  Vitals with BMI  Height '5\' 11"'$  '5\' 11"'$  '5\' 11"'$   Weight 243 lbs 6 oz 242 lbs 3 oz 239 lbs 3 oz  BMI 33.96 77.82 42.35  Systolic 361 443 154  Diastolic 78 83 75  Pulse 008 87 89     Physical Exam  VS: noted--normal. Gen: alert, NAD, NONTOXIC APPEARING. HEENT: eyes without injection, drainage, or swelling.  Ears: EACs clear, TMs with normal light reflex and landmarks.  Nose: Clear rhinorrhea, with some dried, crusty exudate adherent to mildly injected mucosa.  No purulent d/c.  No paranasal sinus TTP.  No facial swelling.  Throat and mouth without  focal lesion.  No pharyngial swelling, erythema, or exudate.   Neck: supple, no LAD.   LUNGS: CTA bilat, nonlabored resps.   CV: RRR, no m/r/g. EXT: no c/c/e SKIN: no rash   LABS:  Last CBC Lab Results  Component Value Date   WBC 7.6 03/15/2021   HGB 14.0 03/15/2021   HCT 41.0 03/15/2021   MCV 84.5 03/15/2021   MCH 29.1 05/18/2020   RDW 15.0 03/15/2021   PLT 229.0 50/93/2671   Last metabolic panel Lab Results  Component Value Date   GLUCOSE 125 (H) 03/15/2021   NA 139 03/15/2021   K 4.2 03/15/2021   CL 101 03/15/2021   CO2 30 03/15/2021   BUN 16 03/15/2021   CREATININE 0.77 03/15/2021   GFRNONAA >60 05/18/2020   CALCIUM 9.7 03/15/2021   PROT 6.7 03/15/2021   ALBUMIN 4.4 03/15/2021   BILITOT 0.7 03/15/2021   ALKPHOS 76 03/15/2021   AST 23 03/15/2021   ALT 30 03/15/2021   ANIONGAP 11 05/18/2020   Lab Results  Component Value Date   HGBA1C 7.0 (H) 03/15/2021   IMPRESSION AND PLAN:  Viral syndrome/influenza-like illness. FLU A/ B today Neg. Patient declined COVID testing. Hycodan syrup, 1 to 2 teaspoon twice daily as needed, 120 mL.  An After Visit Summary was  printed and given to the patient.  FOLLOW UP: Return if symptoms worsen or fail to improve.  Signed:  Crissie Sickles, MD           05/30/2022

## 2023-07-21 ENCOUNTER — Encounter: Payer: Self-pay | Admitting: Family Medicine

## 2023-07-21 ENCOUNTER — Ambulatory Visit: Payer: Medicare HMO | Admitting: Family Medicine

## 2023-07-21 ENCOUNTER — Telehealth: Payer: Self-pay

## 2023-07-21 VITALS — BP 130/80 | HR 86 | Temp 98.2°F | Wt 242.2 lb

## 2023-07-21 DIAGNOSIS — K5900 Constipation, unspecified: Secondary | ICD-10-CM

## 2023-07-21 DIAGNOSIS — R1900 Intra-abdominal and pelvic swelling, mass and lump, unspecified site: Secondary | ICD-10-CM

## 2023-07-21 DIAGNOSIS — K56609 Unspecified intestinal obstruction, unspecified as to partial versus complete obstruction: Secondary | ICD-10-CM | POA: Diagnosis not present

## 2023-07-21 LAB — COMPREHENSIVE METABOLIC PANEL
ALT: 27 U/L (ref 0–53)
AST: 17 U/L (ref 0–37)
Albumin: 4 g/dL (ref 3.5–5.2)
Alkaline Phosphatase: 81 U/L (ref 39–117)
BUN: 16 mg/dL (ref 6–23)
CO2: 30 meq/L (ref 19–32)
Calcium: 9.1 mg/dL (ref 8.4–10.5)
Chloride: 94 meq/L — ABNORMAL LOW (ref 96–112)
Creatinine, Ser: 0.92 mg/dL (ref 0.40–1.50)
GFR: 83.62 mL/min (ref >60.00)
Glucose, Bld: 407 mg/dL — ABNORMAL HIGH (ref 70–99)
Potassium: 4.3 meq/L (ref 3.5–5.1)
Sodium: 133 meq/L — ABNORMAL LOW (ref 135–145)
Total Bilirubin: 0.7 mg/dL (ref 0.2–1.2)
Total Protein: 6.7 g/dL (ref 6.0–8.3)

## 2023-07-21 LAB — CBC WITH DIFFERENTIAL/PLATELET
Basophils Absolute: 0 10*3/uL (ref 0.0–0.1)
Basophils Relative: 0.5 % (ref 0.0–3.0)
Eosinophils Absolute: 0.1 10*3/uL (ref 0.0–0.7)
Eosinophils Relative: 1.3 % (ref 0.0–5.0)
HCT: 40.9 % (ref 39.0–52.0)
Hemoglobin: 13.9 g/dL (ref 13.0–17.0)
Lymphocytes Relative: 16.4 % (ref 12.0–46.0)
Lymphs Abs: 1.5 10*3/uL (ref 0.7–4.0)
MCHC: 34 g/dL (ref 30.0–36.0)
MCV: 84.8 fL (ref 78.0–100.0)
Monocytes Absolute: 0.7 10*3/uL (ref 0.1–1.0)
Monocytes Relative: 7.8 % (ref 3.0–12.0)
Neutro Abs: 6.7 10*3/uL (ref 1.4–7.7)
Neutrophils Relative %: 74 % (ref 43.0–77.0)
Platelets: 277 10*3/uL (ref 150.0–400.0)
RBC: 4.82 Mil/uL (ref 4.22–5.81)
RDW: 14 % (ref 11.5–15.5)
WBC: 9.1 10*3/uL (ref 4.0–10.5)

## 2023-07-21 LAB — C-REACTIVE PROTEIN: CRP: 10.3 mg/dL (ref 0.5–20.0)

## 2023-07-21 NOTE — Telephone Encounter (Signed)
 FYI; Pt has STAT CT.

## 2023-07-21 NOTE — Telephone Encounter (Signed)
 Noted

## 2023-07-21 NOTE — Patient Instructions (Signed)
 Patient will be called with urgent labs and imaging results, further guidance discussed at that time.

## 2023-07-21 NOTE — Progress Notes (Signed)
 David Pearson , [birth year], 73 y.o., male MRN: [id number] Patient Care Team    Relationship Specialty Notifications Start End  McGowen, Maryjean Morn, MD PCP - General Family Medicine  12/15/19   Armbruster, Willaim Rayas, MD Consulting Physician Gastroenterology  12/15/19   Mateo Flow, MD Consulting Physician Ophthalmology  12/15/19   Ernest Mallick, MD Consulting Physician Orthopedic Surgery  02/23/20   Emelia Loron, MD Consulting Physician General Surgery  04/17/20     Chief Complaint  Patient presents with   Flank Pain    Pt states a couple years ago he had R side pain; colonoscopy, endoscopy and MRI were done. Pt was told he had an infection that turned out to be salmonella. Today, pt is experiencing the same pain for the past 3 weeks. Sharp, grabbing pain. Pt has not tried anything to relieve pain.      Subjective: David Pearson is a 73 y.o. Pt presents for an OV with complaints of abdominal pain greater than 3 weeks of duration.   He states he had a infection in his right abdomen a few years ago and symptoms felt similar.  He states it required a drainage tube and surgery, he is not certain if it was an abscess.  Currently, he reports he has been constipated for the last couple months.  He is able to have a bowel movement, last bowel movement was this morning but was very small and he had to strain.  He denies any nausea, vomiting, fever, urinary changes, blood per rectum.  He states the pain can be sharp and grabbing or catching pain right lateral aspect of abdomen.  He had a colonoscopy in 2020 with multiple colon polyps and diverticulosis.  Diverticulosis was mainly located throughout the descending and sigmoid colon.  He did have a polyp removed at the hepatic flexure in 2020.  Patient was asked to follow-up in 3 years for repeat colonoscopy and he has not done so.  He is established with GI Dr. Adela Lank.  Patient reports he has had multiple abdominal surgeries  for hernia repairs, laparoscopically's and a cholecystectomy.  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 NO ANAEROBES ISOLATED Performed at [facility], [address] [place], [place] [address]   Report Status 09/08/2020 FINAL  Organism ID, Bacteria ESCHERICHIA COLI  Resulting Agency [facility]     Susceptibility   Escherichia coli    MIC    AMPICILLIN >=32 RESIST... Resistant    AMPICILLIN/SULBACTAM 8 SENSITIVE Sensitive    CEFAZOLIN <=4 SENSITIVE Sensitive    CEFEPIME <=0.12 SENS... Sensitive    CEFTAZIDIME <=1 SENSITIVE Sensitive    CEFTRIAXONE <=0.25 SENS... Sensitive    CIPROFLOXACIN >=4 RESISTANT Resistant    GENTAMICIN <=1 SENSITIVE Sensitive    IMIPENEM <=0.25 SENS... Sensitive    PIP/TAZO <=4 SENSITIVE Sensitive    TRIMETH/SULFA >=320 RESIS... Resistant           Susceptibility Comments        07/21/2023   10:56 AM 03/15/2022    3:48 PM 03/15/2021    8:08 AM 12/15/2019   10:02 AM 05/08/2018   10:44 AM  Depression screen PHQ 2/9  Decreased Interest 0 0 0 0 0  Down, Depressed, Hopeless 0 0 0 0 0  PHQ - 2 Score 0 0 0 0 0  Altered  sleeping 0      Tired, decreased energy 0      Change in appetite 0      Feeling bad or failure about yourself  0      Trouble concentrating 0      Moving slowly or fidgety/restless 0      Suicidal thoughts 0      PHQ-9 Score 0      Difficult doing work/chores Not difficult at all        No Known Allergies Social History   Social History Narrative   Married, one daughter and one son.   Occup: Psychologist, educational, retired 2021.   Tobacco:quit approx 2016, 25 pack yr hx.   Alc: social         Past Medical History:  Diagnosis Date   Abdominal aortic ectasia (HCC) 05/15/2018   AAA screening 05/2018, due for repeat US in 5 years 05/2023   Cataract    COVID-19     Diabetes mellitus without complication (HCC)    type 2, no meds   Diverticulosis    on colonoscopy 2020   ED (erectile dysfunction)    Gastric polyps    GERD (gastroesophageal reflux disease)    Hearing loss 02/25/2019   bilateral - [facility]   History of iron deficiency anemia    2020.  PO iron x 1 yr.  Hemoccults 1/3 pos 12/2019 (when Hb and iron back to normal)->GI->gastric polyps on EGD.   Hx of adenomatous polyp of colon 2020   sessile serrated polyp x 1->recall 5 yrs (Dr. Adela Lank)   Hyperlipidemia    Intra-abdominal abscess post-procedure (HCC) 2022   not long after having incisional hernia repair-->abscess drained   Osteoarthritis of left knee    Hx of several steroid injections and these helped, meloxicam prn as of 12/2019   Sleep apnea    does not use cpap   Trigger thumb of left hand 2021/2022   inj x 3 by ortho   Past Surgical History:  Procedure Laterality Date   ABDOMINAL HERNIA REPAIR  03/12/2018   abd wall hernia repair x 2   ABDOMINOPLASTY     BIOPSY  07/26/2020   Procedure: BIOPSY;  Surgeon: Lemar Lofty., MD;  Location: [facility];  Service: Gastroenterology;;   cataracts     CHOLECYSTECTOMY N/A 03/12/2018   Procedure: LAPAROSCOPIC CHOLECYSTECTOMY;  Surgeon: Emelia Loron, MD;  Location: [facility] OR;  Service: General;  Laterality: N/A;   COLONOSCOPY  06/12/2018   sessile serrated polyp x 1, recall 2025   ENDOSCOPIC MUCOSAL RESECTION N/A 07/26/2020   Procedure: ENDOSCOPIC MUCOSAL RESECTION;  Surgeon: Lemar Lofty., MD;  Location: [facility];  Service: Gastroenterology;  Laterality: N/A;   ESOPHAGOGASTRODUODENOSCOPY (EGD) WITH PROPOFOL N/A 07/26/2020   Procedure: ESOPHAGOGASTRODUODENOSCOPY (EGD) WITH PROPOFOL;  Surgeon: Meridee Score Netty Starring., MD;  Location: [facility];  Service: Gastroenterology;  Laterality: N/A;   EUS N/A 07/26/2020   Procedure: UPPER ENDOSCOPIC ULTRASOUND (EUS) RADIAL;  Surgeon: Lemar Lofty., MD;   Location: [facility];  Service: Gastroenterology;  Laterality: N/A;   HEMOSTASIS CLIP PLACEMENT  07/26/2020   Procedure: HEMOSTASIS CLIP PLACEMENT;  Surgeon: Meridee Score Netty Starring., MD;  Location: [facility];  Service: Gastroenterology;;   HERNIA REPAIR     3 inguinal hernias   INCISIONAL HERNIA REPAIR N/A 05/18/2020   Procedure: INCISIONAL HERNIA REPAIR;  Surgeon: Emelia Loron, MD;  Location: [facility] OR;  Service: General;  Laterality: N/A;   INSERTION OF MESH N/A 05/18/2020   Procedure:  INSERTION OF MESH;  Surgeon: Emelia Loron, MD;  Location: [facility] OR;  Service: General;  Laterality: N/A;   IR RADIOLOGIST EVAL & MGMT  09/13/2020   LAPAROSCOPY N/A 05/18/2020   Procedure: LAPAROSCOPY DIAGNOSTIC;  Surgeon: Emelia Loron, MD;  Location: [facility] OR;  Service: General;  Laterality: N/A;   POLYPECTOMY  07/26/2020   gastric polypectomy. Procedure: POLYPECTOMY;  Surgeon: Mansouraty, Netty Starring., MD;  Location: [facility];  Service: Gastroenterology;;   Susa Day  07/26/2020   Procedure: Susa Day;  Surgeon: Mansouraty, Netty Starring., MD;  Location: [facility];  Service: Gastroenterology;;   TONSILLECTOMY     with adenoids   Family History  Problem Relation Age of Onset   Breast cancer Maternal Aunt    Cancer Father    Muscular dystrophy Father    COPD Mother    COPD Sister    Colon cancer Neg Hx    Stomach cancer Neg Hx    Rectal cancer Neg Hx    Esophageal cancer Neg Hx    Colon polyps Neg Hx    Allergies as of 07/21/2023   No Known Allergies      Medication List        Accurate as of July 21, 2023 12:35 PM. If you have any questions, ask your nurse or doctor.          HYDROcodone bit-homatropine 5-1.5 MG/5ML syrup Commonly known as: HYCODAN 1-2 tsp po bid prn cough        All past medical history, surgical history, allergies, family history, immunizations andmedications were updated in the EMR today and reviewed under the history and medication  portions of their EMR.     ROS Negative, with the exception of above mentioned in HPI   Objective:  BP 130/80   Pulse 86   Temp 98.2 F (36.8 C)   Wt 242 lb 3.2 oz (109.9 kg)   SpO2 95%   BMI 33.78 kg/m  Body mass index is 33.78 kg/m. Physical Exam Vitals and nursing note reviewed.  Constitutional:      General: He is not in acute distress.    Appearance: Normal appearance. He is not ill-appearing, toxic-appearing or diaphoretic.  HENT:     Head: Normocephalic and atraumatic.  Eyes:     General: No scleral icterus.       Right eye: No discharge.        Left eye: No discharge.     Extraocular Movements: Extraocular movements intact.     Pupils: Pupils are equal, round, and reactive to light.  Abdominal:     General: Bowel sounds are normal. There is distension.     Palpations: Abdomen is soft. There is mass.     Tenderness: There is abdominal tenderness in the right lower quadrant. There is guarding. There is no right CVA tenderness, left CVA tenderness or rebound. Positive signs include McBurney's sign.    Skin:    General: Skin is warm and dry.     Coloration: Skin is not jaundiced or pale.     Findings: No rash.  Neurological:     Mental Status: He is alert and oriented to person, place, and time. Mental status is at baseline.  Psychiatric:        Mood and Affect: Mood normal.        Behavior: Behavior normal.        Thought Content: Thought content normal.        Judgment: Judgment normal.      No  results found. No results found. No results found for this or any previous visit (from the past 24 hours).  Assessment/Plan: Danial Hlavac Pearson is a 73 y.o. male present for OV for  Abs mass-pain/Intestinal obstruction, unspecified cause, unspecified whether partial or complete (HCC)/constipation Pt has not had a normal BM in over 3 weeks with mass right side of abd. He was able to have a very small bowel mvmt yesterday, with straining.  He has had multiple abd  surgeries - adhesions likely present increasing risk.  Mild skin changes over later right abd.  Colonoscopy 2020 with multiple polyps- pt did not follow up in 3 yrs for repeat - CT ABDOMEN PELVIS W CONTRAST; Future-stat - CBC w/Diff-stat - Comp Met (CMET)-stat - C-reactive protein-stat -If constipation patient will need to be put on a bowel regimen, I cannot to start this until CT has resulted. -Urgent GI referral placed, results should be hopefully by the end of the day tomorrow.  At the very least he is overdue for his 3-year follow-up colonoscopy.  Reviewed expectations re: course of current medical issues. Discussed self-management of symptoms. Outlined signs and symptoms indicating need for more acute intervention. Patient verbalized understanding and all questions were answered. Patient received an After-Visit Summary.    Orders Placed This Encounter  Procedures   CT ABDOMEN PELVIS W CONTRAST   CBC w/Diff   Comp Met (CMET)   C-reactive protein   Ambulatory referral to Gastroenterology   No orders of the defined types were placed in this encounter.  Referral Orders         Ambulatory referral to Gastroenterology     46 minutes spent on the day of service with patient for moderate -severe tenderness of abdomen requiring stat labs and imaging  Note is dictated utilizing voice recognition software. Although note has been proof read prior to signing, occasional typographical errors still can be missed. If any questions arise, please do not hesitate to call for verification.   electronically signed by:  Felix Pacini, DO   Primary Care - OR

## 2023-07-22 ENCOUNTER — Telehealth: Payer: Self-pay | Admitting: Internal Medicine

## 2023-07-22 ENCOUNTER — Ambulatory Visit
Admission: RE | Admit: 2023-07-22 | Discharge: 2023-07-22 | Disposition: A | Discharge status: Home / Self Care | Payer: Medicare HMO | Source: Ambulatory Visit | Attending: Family Medicine | Admitting: Family Medicine

## 2023-07-22 ENCOUNTER — Telehealth: Payer: Self-pay | Admitting: Family Medicine

## 2023-07-22 ENCOUNTER — Ambulatory Visit: Payer: Self-pay | Admitting: Family Medicine

## 2023-07-22 DIAGNOSIS — K5730 Diverticulosis of large intestine without perforation or abscess without bleeding: Secondary | ICD-10-CM | POA: Diagnosis not present

## 2023-07-22 DIAGNOSIS — I700 Atherosclerosis of aorta: Secondary | ICD-10-CM | POA: Diagnosis not present

## 2023-07-22 DIAGNOSIS — K56609 Unspecified intestinal obstruction, unspecified as to partial versus complete obstruction: Secondary | ICD-10-CM

## 2023-07-22 DIAGNOSIS — R1031 Right lower quadrant pain: Secondary | ICD-10-CM | POA: Diagnosis not present

## 2023-07-22 DIAGNOSIS — L02211 Cutaneous abscess of abdominal wall: Secondary | ICD-10-CM | POA: Insufficient documentation

## 2023-07-22 MED ORDER — CEFPODOXIME PROXETIL 200 MG PO TABS: 200.0000 mg | ORAL_TABLET | Freq: Two times a day (BID) | ORAL | 0 refills | Status: DC

## 2023-07-22 MED ORDER — IOPAMIDOL (ISOVUE-300) INJECTION 61%
100.0000 mL | Freq: Once | INTRAVENOUS | Status: AC | PRN
  Administered 2023-07-22: 100 mL via INTRAVENOUS

## 2023-07-22 NOTE — Telephone Encounter (Signed)
 CT abd/pelvis shows recurrent abdominal abscess. Dr. Okey Dupre, on call provider notified. Routing to provider.  Copied from CRM [id number]. Topic: Clinical - Lab/Test Results >> Jul 22, 2023  4:57 PM Corin V wrote: Reason for CRM: [facility] calling with critical CT results. Reason for Disposition  Lab or radiology calling with CRITICAL test results  Answer Assessment - Initial Assessment Questions 1. REASON FOR CALL or QUESTION: "What is your reason for calling today?" or "How can I best help you?" or "What question do you have that I can help answer?"     Critical imaging results 2. CALLER: Document the source of call. (e.g., laboratory, patient).     Misty Stanley with [facility]  Protocols used: PCP Call - No Triage-A-AH

## 2023-07-22 NOTE — Telephone Encounter (Signed)
 Called with critical results of recurrent abscess in abdomen. Patient will likely need referral to surgery or IR for drainage and culture followed by antibiotics. Reviewed recent note and he was non-toxic appearing and pain for 3 weeks so will allow provider who saw patient to make this referral.

## 2023-07-22 NOTE — Telephone Encounter (Signed)
 Please call patient and advise him that Dr. Adela Lank does not feel that he can manage this as outpatient. Please have him scheduled with his PCP (Dr. Milinda Cave) urgently to follow-up on CT, elevated sugars and get him set up for IR abscess drainage urgently. Patient was calling in to schedule with his PCP anyway within a week for his sugars, I would expedite this is much as possible, given circumstances.    Again, patient vitals and labs are stable at this time.  He is going about his normal routine.  Discomfort has been present greater than 3 weeks and antibiotic has been started, per prior culture results 2022  Thank you

## 2023-07-22 NOTE — Telephone Encounter (Signed)
 Duplicate

## 2023-07-22 NOTE — Telephone Encounter (Signed)
 Called pt and discussed lab and CT results from our recent appointment 07/21/2023 completed for complaints of abdominal pain greater than 3 weeks CBC with differential within normal range.  CMP with a mildly low sodium at 133 and an elevated glucose of 407, otherwise normal kidney function and liver function. CRP 10.3  He is aware his glucose was > 400. He will call in tomorrow to make an appt this week with his PCP  for further evaluation .  CT ABD/pelvis  results discussed. Patient has developed a recurrent thick walled fluid collection at the site of the previously drained collection, involving the right internal oblique musculature. This measures up to 5.0 x 6.9 cm transverse on image 53/2 and measures approximately 6.8 cm craniocaudal on sagittal image 44/6. On previous drainage, this was reported to yield purulent fluid, and findings are suspicious for a recurrent abscess. Mild inflammatory changes within adjacent retroperitoneal fat without additional focal fluid collection. No foreign body or soft tissue emphysema. Stable postsurgical changes at the umbilicus. No ascites or pneumoperitoneum.   Reviewed cultures from 2022 and placed him on cefpodoxime twice daily, which is what was prescribed to him after 2022 drainage.  He will pick this up and start this right away. Urgent referral placed back to Dr. Adela Lank, which was the managing physician for condition in 2022.  Patient will call Dr. Lanetta Inch office tomorrow morning and let them know urgent referral has been placed to get him scheduled ASAP.  Patient understands if he becomes febrile, develops increased pain, chills or feels ill in any way, he is to report to the emergency room for this condition. At the time of appointment, vitals were stable and patient did not appear toxic.  He has been attending work/meetings per his usual.  Will forward to his PCP and gastroenterologist.    Meds ordered this encounter   Medications   cefpodoxime (VANTIN) 200 MG tablet    Sig: Take 1 tablet (200 mg total) by mouth 2 (two) times daily.    Dispense:  28 tablet    Refill:  0     Orders Placed This Encounter  Procedures   Ambulatory referral to Gastroenterology    Electronically Signed by: Felix Pacini, DO  primary Care- OR

## 2023-07-22 NOTE — Telephone Encounter (Signed)
 Just seeing this message now.  This is something I cannot manage in outpatient clinic.  He warrants IR guided drainage of the abscess.  If he is having significant pain, fevers, then he would need to be admitted for expedited evaluation and treatment.  If his symptoms have been ongoing for some time and he is stable they can try to manage this as outpatient.  I see that his white blood cell count is recently normal which is reassuring.  I would recommend referring him ASAP to IR for outpatient drainage of the abscess, continue antibiotics, and he would need a surgical evaluation given recurrence of this as well.  If you need [place] to place the referral to see IR let me know.

## 2023-07-23 ENCOUNTER — Telehealth: Payer: Self-pay | Admitting: Gastroenterology

## 2023-07-23 NOTE — Telephone Encounter (Signed)
 Spoke with patient regarding results/recommendations.

## 2023-07-23 NOTE — Telephone Encounter (Signed)
 Got it, thanks.  If symptoms stable over time, I think reasonable to continue oral antibiotics and get him into IR as outpatient ASAP and also have him see surgery for their opinion as well. If he worsens while waiting he would need to go to the hospital.   [facility] can you refer to IR for STAT / ASAP consult for drainage of abscess, send fluid for culture / gram fluid. Can you also refer him to [facility] general surgery for their opinion as well given recurrence. Thanks

## 2023-07-23 NOTE — Progress Notes (Unsigned)
 OFFICE VISIT  07/24/2023  CC:  Chief Complaint  Patient presents with   Discuss CT results    Patient is a 73 y.o. male who presents for 3 day f/u abd pain, recently diagnosed recurrence of abdominal wall abscess.  INTERIM HX: David Pearson presented to our office and saw Dr. Claiborne Billings 3 days ago with right abdominal pain for a few weeks. Infrequent bowel movements at that time as well. He has a history of E coli abscess R abd wall 08/2020, IR drained it and all has been well since then up until the last few weeks. A CT abdomen and pelvis with contrast 07/22/2023 showed recurrent thick-walled fluid collection involving the right lateral abdominal wall musculature (the site of previously drained fluid collection), consistent with recurrent abscess.  No evidence of bowel obstruction or acute bowel inflammation.  Fluid collection measured 5.0 x 6.9 cm transverse and 6.8 cm on craniocaudal. He was placed on Vantin 200 mg twice a day and has been taking this without problem.  He says his pain is unchanged.  Describes it as constant in the right lower abdominal wall, occasional acute twinges that are much worse.  He is able to eat and drink but his appetite is a little bit down from baseline.  No abdominal distention. He has had constipation lately but did have a good evacuation after getting oral contrast a few days ago.  No bowel movement since that time. He feels some subjective fever and chills but nothing like rigors and he has not documented a fever. He does not hurt anywhere other than the right lower abdomen.   Additionally -->poorly controlled type 2 DM, Gluc 407 on labs 3 d/a.  Hba1c 03/2021 was 7.0%, then lost to f/u.  He does not eat a diabetic diet. He has polydipsia and chronic feelings of poor urinary stream and incomplete emptying. No dysuria.  ROS as above, plus-->no CP, no SOB, no wheezing, no cough, no dizziness, no HAs, no rashes, no melena/hematochezia.  No myalgias or arthralgias.  No  focal weakness, paresthesias, or tremors.  No acute vision or hearing abnormalities.   No recent changes in lower legs.  No palpitations.       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  PO iron x 1 yr.  Hemoccults 1/3 pos 12/2019 (when Hb and iron back to normal)->GI->gastric polyps on EGD.   Hx of adenomatous polyp of colon 2020   sessile serrated polyp x 1->recall 5 yrs (Dr. Adela Lank)   Hyperlipidemia    Intra-abdominal abscess post-procedure (HCC) 2022   not long after having incisional hernia repair-->abscess drained.  Recurrence 07/2023   Osteoarthritis of left knee    Hx of several steroid injections and these helped, meloxicam prn as of 12/2019   Sleep apnea    does not use cpap   Trigger thumb of left hand 2021/2022   inj x 3 by ortho    Past Surgical History:  Procedure Laterality Date  ABDOMINAL HERNIA REPAIR  03/12/2018   abd wall hernia repair x 2   ABDOMINOPLASTY     BIOPSY  07/26/2020   Procedure: BIOPSY;  Surgeon: Meridee Score Netty Starring., MD;  Location: [facility];  Service: Gastroenterology;;   cataracts     CHOLECYSTECTOMY N/A 03/12/2018   Procedure: LAPAROSCOPIC CHOLECYSTECTOMY;  Surgeon: Emelia Loron, MD;  Location: [facility] OR;  Service: General;  Laterality: N/A;   COLONOSCOPY  06/12/2018   sessile serrated polyp x 1, recall 2025   ENDOSCOPIC MUCOSAL RESECTION N/A 07/26/2020   Procedure: ENDOSCOPIC MUCOSAL RESECTION;  Surgeon: Lemar Lofty., MD;  Location: [facility];  Service: Gastroenterology;  Laterality: N/A;    ESOPHAGOGASTRODUODENOSCOPY (EGD) WITH PROPOFOL N/A 07/26/2020   Procedure: ESOPHAGOGASTRODUODENOSCOPY (EGD) WITH PROPOFOL;  Surgeon: Meridee Score Netty Starring., MD;  Location: [facility];  Service: Gastroenterology;  Laterality: N/A;   EUS N/A 07/26/2020   Procedure: UPPER ENDOSCOPIC ULTRASOUND (EUS) RADIAL;  Surgeon: Lemar Lofty., MD;  Location: [facility];  Service: Gastroenterology;  Laterality: N/A;   HEMOSTASIS CLIP PLACEMENT  07/26/2020   Procedure: HEMOSTASIS CLIP PLACEMENT;  Surgeon: Meridee Score Netty Starring., MD;  Location: [facility];  Service: Gastroenterology;;   HERNIA REPAIR     3 inguinal hernias   INCISIONAL HERNIA REPAIR N/A 05/18/2020   Procedure: INCISIONAL HERNIA REPAIR;  Surgeon: Emelia Loron, MD;  [facility];  Service: General;  Laterality: N/A;   INSERTION OF MESH N/A 05/18/2020   Procedure: INSERTION OF MESH;  Surgeon: Emelia Loron, MD;  Location: [facility] OR;  Service: General;  Laterality: N/A;   IR RADIOLOGIST EVAL & MGMT  09/13/2020   LAPAROSCOPY N/A 05/18/2020   Procedure: LAPAROSCOPY DIAGNOSTIC;  Surgeon: Emelia Loron, MD;  Location: [facility] OR;  Service: General;  Laterality: N/A;   POLYPECTOMY  07/26/2020   gastric polypectomy. Procedure: POLYPECTOMY;  Surgeon: Meridee Score Netty Starring., MD;  Location: [facility];  Service: Gastroenterology;;   Susa Day  07/26/2020   Procedure: Susa Day;  Surgeon: Lemar Lofty., MD;  Location: [facility];  Service: Gastroenterology;;   TONSILLECTOMY     with adenoids    Outpatient Medications Prior to Visit  Medication Sig Dispense Refill   cefpodoxime (VANTIN) 200 MG tablet Take 1 tablet (200 mg total) by mouth 2 (two) times daily. 28 tablet 0   HYDROcodone bit-homatropine (HYCODAN) 5-1.5 MG/5ML syrup 1-2 tsp po bid prn cough (Patient not taking: Reported on 07/24/2023) 120 mL 0   No facility-administered medications prior to visit.    No Known Allergies  Review of  Systems As per HPI  PE:    07/24/2023   12:54 PM 07/21/2023   10:53 AM 05/30/2022    8:36 AM  Vitals with BMI  Height 5\' 11"   5\' 11"   Weight 240 lbs 3 oz 242 lbs 3 oz 243 lbs 6 oz  BMI 33.52  33.96  Systolic 120 130 045  Diastolic 85 80 78  Pulse 96 86 108     Physical Exam  Gen: Alert, well appearing.  Patient is oriented to person, place, time, and situation. AFFECT: pleasant, lucid thought and speech. WUJ:WJXB: no injection, icteris, swelling, or exudate.  EOMI, PERRLA. Mouth: lips without lesion/swelling.  Oral mucosa pink and moist. Oropharynx without erythema, exudate, or swelling.  CV: RRR, no m/r/g.   LUNGS: CTA bilat, nonlabored resps, good aeration in all lung fields. ABD: soft, nondistended.  Focal tenderness to palpation in a softball sized area in the right lower quadrant extending around the right side a little bit.  No guarding or rebound.  No palpable mass.  No erythema or warmth. Bowel sounds normal.  LABS:  Last CBC Lab Results  Component Value Date   WBC 9.1 07/21/2023   HGB 13.9 07/21/2023   HCT 40.9 07/21/2023   MCV 84.8 07/21/2023   MCH 29.1 05/18/2020   RDW 14.0 07/21/2023   PLT 277.0 07/21/2023   Last metabolic panel Lab Results  Component Value Date   GLUCOSE 407 (H) 07/21/2023   NA 133 (L) 07/21/2023   K 4.3 07/21/2023   CL 94 (L) 07/21/2023   CO2 30 07/21/2023   BUN 16 07/21/2023   CREATININE 0.92 07/21/2023   GFR 83.62 07/21/2023   CALCIUM 9.1 07/21/2023   PROT 6.7 07/21/2023   ALBUMIN 4.0 07/21/2023   BILITOT 0.7 07/21/2023   ALKPHOS 81 07/21/2023   AST 17 07/21/2023   ALT 27 07/21/2023   ANIONGAP 11 05/18/2020   Last hemoglobin A1c Lab Results  Component Value Date   HGBA1C 7.0 (H) 03/15/2021   Lab Results  Component Value Date   CHOL 144 03/15/2021   HDL 39.20 03/15/2021   LDLCALC 76 03/20/2020   LDLDIRECT 79.0 03/15/2021   TRIG 201.0 (H) 03/15/2021   CHOLHDL 4 03/15/2021   IMPRESSION AND PLAN:  #1 right  abdominal wall abscess, recurrent. Continue Vantin. Urgent interventional radiology consult is in process per GI office (Dr. Adela Lank). Will follow CBC and metabolic panel today. Fortunately he does not appear acutely ill but I recommended he seek care at the nearest emergency department if he develops high fever, worsening abdominal pain, or nausea and vomiting. Otherwise, I will see him on 07/28/2023. He has appointment with gastroenterology on 07/30/2023.  Will ask general surgeon to see in near future as well.  #2 poorly controlled type 2 diabetes without complication. Recommended starting insulin but he said he will not give himself an injection. Metformin caused intolerable GI side effects in the past. He is agreeable to starting glipizide 10 mg every morning. I recommended he start following his glucoses twice a day but he declined. I asked him to check with his insurer about coverage for Jardiance, Invokana, or Farxiga and perhaps we can add 1 of these to his glipizide if it is affordable. Check hemoglobin A1c and urine microalbumin/creatinine today.  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 Urinalysis today positive for blood and protein. Will send for microscopy.  An After Visit Summary was printed and given to the patient.  FOLLOW UP: Return for Keep your appt on 07/28/23 to f/u glucoses and abd wall abscess.  Signed:  Santiago Bumpers, MD           07/24/2023

## 2023-07-23 NOTE — Telephone Encounter (Addendum)
 Urgent referral, records, pt's demographic, and insurance information faxed to [facility] at [phone].  Urgent Ambulatory referral to IR in epic.

## 2023-07-23 NOTE — Addendum Note (Signed)
 Addended by: Marisa Sprinkles on: 07/23/2023 11:13 AM   Modules accepted: Orders

## 2023-07-23 NOTE — Telephone Encounter (Signed)
 Noted

## 2023-07-23 NOTE — Telephone Encounter (Signed)
 Appt has been rescheduled to Wednesday, 07/30/23 at 1:30 pm with Hyacinth Meeker, PA-C. Soonest appt. Please let patient know. Thanks

## 2023-07-23 NOTE — Telephone Encounter (Signed)
Patient has been made aware of appointment

## 2023-07-23 NOTE — Telephone Encounter (Signed)
 Urgent triage in [facility] for Abdominal wall abscess on CT.  Patient is scheduled with Boone Master on 08/22/23 and requesting sooner appt.   Please advise.  Thanks

## 2023-07-24 ENCOUNTER — Encounter: Payer: Self-pay | Admitting: Family Medicine

## 2023-07-24 ENCOUNTER — Ambulatory Visit (INDEPENDENT_AMBULATORY_CARE_PROVIDER_SITE_OTHER): Payer: Medicare HMO | Admitting: Family Medicine

## 2023-07-24 VITALS — BP 120/85 | HR 96 | Ht 71.0 in | Wt 240.2 lb

## 2023-07-24 DIAGNOSIS — Z7984 Long term (current) use of oral hypoglycemic drugs: Secondary | ICD-10-CM

## 2023-07-24 DIAGNOSIS — E119 Type 2 diabetes mellitus without complications: Secondary | ICD-10-CM

## 2023-07-24 DIAGNOSIS — L02211 Cutaneous abscess of abdominal wall: Secondary | ICD-10-CM

## 2023-07-24 DIAGNOSIS — R339 Retention of urine, unspecified: Secondary | ICD-10-CM | POA: Diagnosis not present

## 2023-07-24 LAB — URINALYSIS, ROUTINE W REFLEX MICROSCOPIC
Bilirubin Urine: NEGATIVE
Ketones, ur: NEGATIVE
Leukocytes,Ua: NEGATIVE
Nitrite: NEGATIVE
Specific Gravity, Urine: 1.025 (ref 1.000–1.030)
Urine Glucose: NEGATIVE
Urobilinogen, UA: 0.2 (ref 0.0–1.0)
pH: 6 (ref 5.0–8.0)

## 2023-07-24 LAB — COMPREHENSIVE METABOLIC PANEL
ALT: 27 U/L (ref 0–53)
AST: 17 U/L (ref 0–37)
Albumin: 3.8 g/dL (ref 3.5–5.2)
Alkaline Phosphatase: 76 U/L (ref 39–117)
BUN: 15 mg/dL (ref 6–23)
CO2: 30 meq/L (ref 19–32)
Calcium: 8.8 mg/dL (ref 8.4–10.5)
Chloride: 95 meq/L — ABNORMAL LOW (ref 96–112)
Creatinine, Ser: 0.97 mg/dL (ref 0.40–1.50)
GFR: 78.47 mL/min (ref >60.00)
Glucose, Bld: 226 mg/dL — ABNORMAL HIGH (ref 70–99)
Potassium: 4 meq/L (ref 3.5–5.1)
Sodium: 134 meq/L — ABNORMAL LOW (ref 135–145)
Total Bilirubin: 0.6 mg/dL (ref 0.2–1.2)
Total Protein: 6.6 g/dL (ref 6.0–8.3)

## 2023-07-24 LAB — MICROALBUMIN / CREATININE URINE RATIO
Creatinine,U: 134.3 mg/dL
Microalb Creat Ratio: 45.6 mg/g — ABNORMAL HIGH (ref 0.0–30.0)
Microalb, Ur: 6.1 mg/dL — ABNORMAL HIGH (ref 0.0–1.9)

## 2023-07-24 LAB — POCT URINALYSIS DIPSTICK
Bilirubin, UA: NEGATIVE
Blood, UA: POSITIVE
Glucose, UA: NEGATIVE
Ketones, UA: NEGATIVE
Leukocytes, UA: NEGATIVE
Nitrite, UA: NEGATIVE
Protein, UA: POSITIVE — AB
Spec Grav, UA: 1.025 (ref 1.010–1.025)
Urobilinogen, UA: 0.2 U/dL
pH, UA: 6 (ref 5.0–8.0)

## 2023-07-24 LAB — HEMOGLOBIN A1C: Hgb A1c MFr Bld: 9.9 % — ABNORMAL HIGH (ref 4.6–6.5)

## 2023-07-24 MED ORDER — BLOOD GLUCOSE MONITOR KIT: PACK | 0 refills | Status: AC

## 2023-07-24 MED ORDER — GLIPIZIDE 10 MG PO TABS: 10.0000 mg | ORAL_TABLET | Freq: Two times a day (BID) | ORAL | 0 refills | Status: DC

## 2023-07-24 NOTE — Patient Instructions (Signed)
 Take otc senakot-S 2 tabs per day for constipation (generic is fine).  Check with your insurer about coverage for the following diabetes medication:  Jardiance, invokana, and farxiga. We may add one of these to the glipizide I prescribed today if they cover.  Check your fasting glucose every morning and every evening 2 hours after a meal, write the numbers down and bring in for review with me in 2 weeks.  If you start to feel worsening abdominal pain, develop nausea and vomiting, or start to get fevers >101 then seek care at the nearest emergency room.

## 2023-07-28 ENCOUNTER — Ambulatory Visit (INDEPENDENT_AMBULATORY_CARE_PROVIDER_SITE_OTHER): Payer: Medicare HMO | Admitting: Family Medicine

## 2023-07-28 ENCOUNTER — Encounter: Payer: Self-pay | Admitting: Family Medicine

## 2023-07-28 VITALS — BP 108/75 | HR 99 | Ht 71.0 in | Wt 236.2 lb

## 2023-07-28 DIAGNOSIS — E1129 Type 2 diabetes mellitus with other diabetic kidney complication: Secondary | ICD-10-CM | POA: Diagnosis not present

## 2023-07-28 DIAGNOSIS — E119 Type 2 diabetes mellitus without complications: Secondary | ICD-10-CM

## 2023-07-28 DIAGNOSIS — R809 Proteinuria, unspecified: Secondary | ICD-10-CM | POA: Diagnosis not present

## 2023-07-28 DIAGNOSIS — Z7984 Long term (current) use of oral hypoglycemic drugs: Secondary | ICD-10-CM | POA: Diagnosis not present

## 2023-07-28 DIAGNOSIS — L02211 Cutaneous abscess of abdominal wall: Secondary | ICD-10-CM | POA: Diagnosis not present

## 2023-07-28 DIAGNOSIS — E118 Type 2 diabetes mellitus with unspecified complications: Secondary | ICD-10-CM

## 2023-07-28 NOTE — Telephone Encounter (Signed)
 Per referral note today- S/w David Pearson In Interventional Rad. She stated there needed to be an order placed by Provider in order for it to be scheduled. I provided PCP with the IMG [id number] Which maybe the correct order #.   Order entered into epic. Secure staff message sent to radiology scheduling to contact patient ASAP for CT guided drainage.

## 2023-07-28 NOTE — Addendum Note (Signed)
 Addended by: Marisa Sprinkles on: 07/28/2023 03:43 PM   Modules accepted: Orders

## 2023-07-28 NOTE — Progress Notes (Signed)
 OFFICE VISIT  07/28/2023  CC:  Chief Complaint  Patient presents with   Medical Management of Chronic Issues    1 wk f/u sugars; pt has been checking sugars, most recent AM reading 134, PM reading 105. (2/21 AM 200, PM 162; 2/22 AM 193 PM 119, 2/23 AM 128)    Patient is a 73 y.o. male who presents for 4-day follow-up poorly controlled diabetes and abdominal wall abscess. A/P as of last visit: "#1 right abdominal wall abscess, recurrent. Continue Vantin. Urgent interventional radiology consult is in process per GI office (Dr. Adela Lank). Will follow CBC and metabolic panel today. Fortunately he does not appear acutely ill but I recommended he seek care at the nearest emergency department if he develops high fever, worsening abdominal pain, or nausea and vomiting. Otherwise, I will see him on 07/28/2023. He has appointment with gastroenterology on 07/30/2023.   Will ask general surgeon to see in near future as well.   #2 poorly controlled type 2 diabetes without complication. Recommended starting insulin but he said he will not give himself an injection. Metformin caused intolerable GI side effects in the past. He is agreeable to starting glipizide 10 mg every morning. I recommended he start following his glucoses twice a day but he declined. I asked him to check with his insurer about coverage for Jardiance, Invokana, or Farxiga and perhaps we can add 1 of these to his glipizide if it is affordable. Check hemoglobin A1c and urine microalbumin/creatinine today.   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 Urinalysis today positive for blood and protein. Will send for microscopy."  INTERIM HX: Hemoglobin A1c last visit 9.9%. Urine microscopy showed no red blood cells.  His pain is unchanged. No fever. He is eating much better/healthier, plus his appetite has been down since having the abdominal pain. Since getting on glipizide 10 mg twice a day he has noted his  glucoses come down to the low to mid 100s consistently.  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  PO iron x 1 yr.  Hemoccults 1/3 pos 12/2019 (when Hb and iron back to normal)->GI->gastric polyps on EGD.   Hx of adenomatous polyp of colon 2020   sessile serrated polyp x 1->recall 5 yrs (Dr. Adela Lank)   Hyperlipidemia    Intra-abdominal abscess post-procedure (HCC) 2022   not long after having incisional hernia repair-->abscess drained.  Recurrence 07/2023   Osteoarthritis of left knee    Hx of several steroid injections and these helped, meloxicam prn as of 12/2019   Sleep apnea    does not use cpap   Trigger thumb of left hand 2021/2022   inj x 3 by ortho    Past Surgical History:  Procedure Laterality Date   ABDOMINAL HERNIA REPAIR  03/12/2018   abd wall hernia repair x 2   ABDOMINOPLASTY     BIOPSY  07/26/2020   Procedure: BIOPSY;  Surgeon: Lemar Lofty., MD;  Location: [facility];  Service:  Gastroenterology;;   cataracts     CHOLECYSTECTOMY N/A 03/12/2018   Procedure: LAPAROSCOPIC CHOLECYSTECTOMY;  Surgeon: Emelia Loron, MD;  Location: [facility] OR;  Service: General;  Laterality: N/A;   COLONOSCOPY  06/12/2018   sessile serrated polyp x 1, recall 2025   ENDOSCOPIC MUCOSAL RESECTION N/A 07/26/2020   Procedure: ENDOSCOPIC MUCOSAL RESECTION;  Surgeon: Lemar Lofty., MD;  Location: [facility];  Service: Gastroenterology;  Laterality: N/A;   ESOPHAGOGASTRODUODENOSCOPY (EGD) WITH PROPOFOL N/A 07/26/2020   Procedure:  ESOPHAGOGASTRODUODENOSCOPY (EGD) WITH PROPOFOL;  Surgeon: Meridee Score Netty Starring., MD;  Location: [facility];  Service: Gastroenterology;  Laterality: N/A;   EUS N/A 07/26/2020   Procedure: UPPER ENDOSCOPIC ULTRASOUND (EUS) RADIAL;  Surgeon: Lemar Lofty., MD;  Location: [facility];  Service: Gastroenterology;  Laterality: N/A;   HEMOSTASIS CLIP PLACEMENT  07/26/2020   Procedure: HEMOSTASIS CLIP PLACEMENT;  Surgeon: Meridee Score Netty Starring., MD;  Location: [facility];  Service: Gastroenterology;;   HERNIA REPAIR     3 inguinal hernias   INCISIONAL HERNIA REPAIR N/A 05/18/2020   Procedure: INCISIONAL HERNIA REPAIR;  Surgeon: Emelia Loron, MD;  [facility] OR;  Service: General;  Laterality: N/A;   INSERTION OF MESH N/A 05/18/2020   Procedure: INSERTION OF MESH;  Surgeon: Emelia Loron, MD;  Location: [facility] OR;  Service: General;  Laterality: N/A;   IR RADIOLOGIST EVAL & MGMT  09/13/2020   LAPAROSCOPY N/A 05/18/2020   Procedure: LAPAROSCOPY DIAGNOSTIC;  Surgeon: Emelia Loron, MD;  Location: [facility] OR;  Service: General;  Laterality: N/A;   POLYPECTOMY  07/26/2020   gastric polypectomy. Procedure: POLYPECTOMY;  Surgeon: Lemar Lofty., MD;  Location: [facility];  Service: Gastroenterology;;   Susa Day  07/26/2020   Procedure: Susa Day;  Surgeon: Lemar Lofty., MD;  Location: [facility];  Service: Gastroenterology;;   TONSILLECTOMY     with adenoids    Outpatient Medications Prior to Visit  Medication Sig Dispense Refill   blood glucose meter kit and supplies KIT Check glucose every morning and every afternoon approx 2 hrs after a meal 1 each 0   cefpodoxime (VANTIN) 200 MG tablet Take 1 tablet (200 mg total) by mouth 2 (two) times daily. 28 tablet 0   glipiZIDE (GLUCOTROL) 10 MG tablet Take 1 tablet (10 mg total) by mouth 2 (two) times daily before a meal. 60 tablet 0   No facility-administered medications prior to visit.    No  Known Allergies  Review of Systems As per HPI  PE:    07/28/2023    1:24 PM 07/24/2023   12:54 PM 07/21/2023   10:53 AM  Vitals with BMI  Height 5\' 11"  5\' 11"    Weight 236 lbs 3 oz 240 lbs 3 oz 242 lbs 3 oz  BMI 32.96 33.52   Systolic 108 120 098  Diastolic 75 85 80  Pulse 99 96 86     Physical Exam  Gen: Alert, well appearing.  Patient is oriented to person, place, time, and situation. AFFECT: pleasant, lucid thought and speech. R mid abd wall fairly focal TTP about the size of a baseball  No guarding or rebound tenderness.  No fluctuance or mass.  No overlying erythema or bruising.  LABS:  Last CBC Lab Results  Component Value Date   WBC 9.1 07/21/2023   HGB 13.9 07/21/2023   HCT 40.9 07/21/2023   MCV 84.8 07/21/2023   MCH 29.1 05/18/2020   RDW 14.0 07/21/2023   PLT 277.0 07/21/2023   Last metabolic panel Lab Results  Component Value Date   GLUCOSE 226 (H) 07/24/2023   NA 134 (L) 07/24/2023   K 4.0 07/24/2023   CL 95 (L) 07/24/2023   CO2 30 07/24/2023   BUN 15 07/24/2023   CREATININE 0.97 07/24/2023   GFR 78.47 07/24/2023   CALCIUM 8.8 07/24/2023   PROT 6.6 07/24/2023   ALBUMIN 3.8 07/24/2023   BILITOT 0.6 07/24/2023   ALKPHOS 76 07/24/2023   AST 17 07/24/2023   ALT 27 07/24/2023   ANIONGAP 11 05/18/2020   Last hemoglobin A1c Lab Results  Component Value Date   HGBA1C 9.9 (H) 07/24/2023   IMPRESSION AND PLAN:  Right abdominal wall abscess, recurrent.  Trying to get him set up with IR for drainage. Urgent referral was placed by Dr. Adela Lank on 07/23/2023.  Will follow-up on this again. He has follow-up with gastroenterology in 2 days and he has consult with general surgeon on 07/31/2023 (Dr. Thurman Coyer with [facility] surgery). He is stable.  Continue Vantin. Signs/symptoms to call or return for were reviewed and pt expressed understanding.  An After Visit Summary was printed and given to the patient.  FOLLOW UP: Return for Follow-up to be  determined based on interventional radiology and gastroenterology and surgery appts.  Signed:  Santiago Bumpers, MD           07/28/2023

## 2023-07-28 NOTE — Addendum Note (Signed)
 Addended by: Jeoffrey Massed on: 07/28/2023 03:50 PM   Modules accepted: Orders

## 2023-07-29 NOTE — Progress Notes (Signed)
 David Balm, MD  Claudean Kinds Ok  CT drain R abd collection +mod sedation  DDH       Previous Messages    ----- Message ----- From: Claudean Kinds Sent: 07/29/2023  12:38 PM EST To: Claudean Kinds; Ir Procedure Requests Subject: CT GUIDED VISCERAL FLUID DRAIN BY PERC CATH    Procedure : CT GUIDED VISCERAL FLUID DRAIN BY PERC CATH  Reason : abdominal wall abscess Dx: Abdominal wall abscess [L02.211 (ICD-10-CM)]     History : Ct abd pelv w./  Provider : Benancio Deeds, MD Provider contact : [phone]

## 2023-07-30 ENCOUNTER — Ambulatory Visit: Payer: Medicare HMO | Admitting: Physician Assistant

## 2023-07-30 ENCOUNTER — Other Ambulatory Visit: Payer: Self-pay | Admitting: Radiology

## 2023-07-30 ENCOUNTER — Encounter: Payer: Self-pay | Admitting: Physician Assistant

## 2023-07-30 VITALS — BP 104/68 | HR 97 | Ht 71.0 in | Wt 238.0 lb

## 2023-07-30 DIAGNOSIS — R109 Unspecified abdominal pain: Secondary | ICD-10-CM

## 2023-07-30 DIAGNOSIS — L02211 Cutaneous abscess of abdominal wall: Secondary | ICD-10-CM | POA: Diagnosis not present

## 2023-07-30 DIAGNOSIS — R188 Other ascites: Secondary | ICD-10-CM

## 2023-07-30 NOTE — Patient Instructions (Signed)
 You have been scheduled for an appointment with _____________ at [facility]. Your appointment is on _______ at _____. Please arrive at ___________ for registration. Make certain to bring a list of current medications, including any over the counter medications or vitamins. Also bring your co-pay if you have one as well as your insurance cards. [facility] is located at [address]. Should you need to reschedule your appointment, please contact them at [phone].  _______________________________________________________  If your blood pressure at your visit was 140/90 or greater, please contact your primary care physician to follow up on this.  _______________________________________________________  If you are age 81 or older, your body mass index should be between 23-30. Your Body mass index is 33.19 kg/m. If this is out of the aforementioned range listed, please consider follow up with your Primary Care Provider.  If you are age 39 or younger, your body mass index should be between 19-25. Your Body mass index is 33.19 kg/m. If this is out of the aformentioned range listed, please consider follow up with your Primary Care Provider.   ________________________________________________________  The [place] providers would like to encourage you to use [facility] to communicate with providers for non-urgent requests or questions.  Due to long hold times on the telephone, sending your provider a message by [facility] may be a faster and more efficient way to get a response.  Please allow 48 business hours for a response.  Please remember that this is for non-urgent requests.  _______________________________________________________

## 2023-07-30 NOTE — Progress Notes (Signed)
 Agree with assessment and plan as outlined. Rather unusual, recurrent abdominal wall abscess of unclear etiology.  His colonoscopy is up-to-date and showed some diverticular disease but no high risk pathology.  Agree with antibiotics and he ultimately needs drainage of this and has it scheduled with IR.  Will be done tomorrow and I think that will make him feel much better.  I do think he needs a surgical evaluation given recurrence.  I would like for him to see surgery first and if they feel he warrants a colonoscopy before any intervention we can do that although I think may be lower yield.  Await drainage, cultures etc. drain management per IR after it is placed.

## 2023-07-30 NOTE — H&P (Signed)
 Chief Complaint: Patient was seen in consultation today for abdominal wall abscess.  Referring Physician(s): Armbruster,Steven P  Supervising Physician: Roanna Banning  Patient Status: [facility] - Out-[facility]  History of Present Illness: David Pearson is a 73 y.o. male with a past medical history significant for GERD, anemia, HLD, DM, recurrent right abdominal wall abscess who presents today for aspiration/possible drain placement of right abdominal wall abscess. [name] underwent umbilical hernia repair in 2019 and incisional hernia repair with mesh placement in 2021 due to recurrence. He later developed a right lower quadrant abdominal wall fluid collection for which he underwent image guided aspiration/drain placement in IR (Dr. Archer Asa) on 09/01/20. The drain was removed on 09/13/20 as no residual fluid was seen on follow up CT. He developed right lower abdominal pain late last year and underwent CT abd/pelvis w/contrast 07/22/23 which showed:  1. Recurrent thick walled fluid collection involving the right lateral abdominal wall musculature, consistent with a recurrent abscess. 2. No evidence of bowel obstruction or acute bowel inflammation. Normal appendix. 3. Colonic diverticulosis without evidence of acute diverticulitis or bowel obstruction. 4.  Aortic Atherosclerosis (ICD10-I70.0).  He has again been referred to IR for aspiration/possible drain placement of right abdominal wall abscess.   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  PO iron x 1 yr.  Hemoccults  1/3 pos 12/2019 (when Hb and iron back to normal)->GI->gastric polyps on EGD.   Hx of adenomatous polyp of colon 2020   sessile serrated polyp x 1->recall 5 yrs (Dr. Adela Lank)   Hyperlipidemia    Intra-abdominal abscess post-procedure (HCC) 2022   not long after having incisional hernia repair-->abscess drained.  Recurrence 07/2023   Osteoarthritis of left knee    Hx of several steroid injections and these helped, meloxicam prn as of 12/2019   Sleep apnea    does not use cpap   Trigger thumb of left hand 2021/2022   inj x 3 by ortho    Past Surgical History:  Procedure Laterality Date   ABDOMINAL HERNIA REPAIR  03/12/2018   abd wall hernia repair x 2   ABDOMINOPLASTY     BIOPSY  07/26/2020   Procedure: BIOPSY;  Surgeon: Lemar Lofty., MD;  Location: [facility];  Service: Gastroenterology;;   cataracts     CHOLECYSTECTOMY N/A 03/12/2018   Procedure: LAPAROSCOPIC CHOLECYSTECTOMY;  Surgeon: Emelia Loron, MD;  Location: [facility] OR;  Service: General;  Laterality: N/A;   COLONOSCOPY  06/12/2018   sessile serrated polyp x 1, recall 2025   ENDOSCOPIC MUCOSAL RESECTION N/A 07/26/2020   Procedure: ENDOSCOPIC MUCOSAL RESECTION;  Surgeon: Lemar Lofty., MD;  Location: [facility];  Service: Gastroenterology;  Laterality: N/A;   ESOPHAGOGASTRODUODENOSCOPY (EGD) WITH PROPOFOL N/A 07/26/2020   Procedure: ESOPHAGOGASTRODUODENOSCOPY (EGD) WITH PROPOFOL;  Surgeon: Meridee Score Netty Starring., MD;  Location: [facility];  Service: Gastroenterology;  Laterality: N/A;   EUS N/A 07/26/2020   Procedure: UPPER ENDOSCOPIC ULTRASOUND (EUS) RADIAL;  Surgeon: Lemar Lofty.,  MD;  Location: [facility];  Service: Gastroenterology;  Laterality: N/A;   HEMOSTASIS CLIP PLACEMENT  07/26/2020   Procedure: HEMOSTASIS CLIP PLACEMENT;  Surgeon: Meridee Score Netty Starring., MD;  Location: [facility];  Service: Gastroenterology;;   HERNIA REPAIR     3 inguinal hernias   INCISIONAL HERNIA  REPAIR N/A 05/18/2020   Procedure: INCISIONAL HERNIA REPAIR;  Surgeon: Emelia Loron, MD;  Location: [facility] OR;  Service: General;  Laterality: N/A;   INSERTION OF MESH N/A 05/18/2020   Procedure: INSERTION OF MESH;  Surgeon: Emelia Loron, MD;  Location: [facility] OR;  Service: General;  Laterality: N/A;   IR RADIOLOGIST EVAL & MGMT  09/13/2020   LAPAROSCOPY N/A 05/18/2020   Procedure: LAPAROSCOPY DIAGNOSTIC;  Surgeon: Emelia Loron, MD;  Location: [facility] OR;  Service: General;  Laterality: N/A;   POLYPECTOMY  07/26/2020   gastric polypectomy. Procedure: POLYPECTOMY;  Surgeon: Meridee Score Netty Starring., MD;  Location: [facility];  Service: Gastroenterology;;   Susa Day  07/26/2020   Procedure: Susa Day;  Surgeon: Mansouraty, Netty Starring., MD;  Location: [facility];  Service: Gastroenterology;;   TONSILLECTOMY     with adenoids    Allergies: Patient has no known allergies.  Medications: Prior to Admission medications   Medication Sig Start Date End Date Taking? Authorizing Provider  blood glucose meter kit and supplies KIT Check glucose every morning and every afternoon approx 2 hrs after a meal 07/24/23   McGowen, Maryjean Morn, MD  cefpodoxime (VANTIN) 200 MG tablet Take 1 tablet (200 mg total) by mouth 2 (two) times daily. 07/22/23   Kuneff, Renee A, DO  glipiZIDE (GLUCOTROL) 10 MG tablet Take 1 tablet (10 mg total) by mouth 2 (two) times daily before a meal. 07/24/23   McGowen, Maryjean Morn, MD     Family History  Problem Relation Age of Onset   Breast cancer Maternal Aunt    Cancer Father    Muscular dystrophy Father    COPD Mother    COPD Sister    Colon cancer Neg Hx    Stomach cancer Neg Hx    Rectal cancer Neg Hx    Esophageal cancer Neg Hx    Colon polyps Neg Hx     Social History   Socioeconomic History   Marital status: Married    Spouse name: Not on file   Number of children: Not on file   Years of education: Not on file   Highest education level: Bachelor's  degree (e.g., BA, AB, BS)  Occupational History   Not on file  Tobacco Use   Smoking status: Former    Current packs/day: 0.00    Types: Cigarettes    Start date: 2009    Quit date: 2014    Years since quitting: 11.1   Smokeless tobacco: Never  Vaping Use   Vaping status: Never Used  Substance and Sexual Activity   Alcohol use: Yes    Alcohol/week: 4.0 standard drinks of alcohol    Types: 4 Standard drinks or equivalent per week   Drug use: Not Currently   Sexual activity: Yes  Other Topics Concern   Not on file  Social History Narrative   Married, one daughter and one son.   Occup: Psychologist, educational, retired 2021.   Tobacco:quit approx 2016, 25 pack yr hx.   Alc: social         Social Drivers of Health   Financial Resource Strain: Low Risk  (07/23/2023)   Overall Financial Resource Strain (CARDIA)    Difficulty  of Paying Living Expenses: Not very hard  Food Insecurity: No Food Insecurity (07/23/2023)   Hunger Vital Sign    Worried About Running Out of Food in the Last Year: Never true    Ran Out of Food in the Last Year: Never true  Transportation Needs: No Transportation Needs (07/23/2023)   PRAPARE - Administrator, Civil Service (Medical): No    Lack of Transportation (Non-Medical): No  Physical Activity: Insufficiently Active (05/29/2022)   Exercise Vital Sign    Days of Exercise per Week: 2 days    Minutes of Exercise per Session: 30 min  Stress: No Stress Concern Present (07/23/2023)   [employer] of Occupational Health - Occupational Stress Questionnaire    Feeling of Stress : Only a little  Social Connections: Unknown (07/23/2023)   Social Connection and Isolation Panel [NHANES]    Frequency of Communication with Friends and Family: Twice a week    Frequency of Social Gatherings with Friends and Family: Patient declined    Attends Religious Services: Never    Database administrator or Organizations: No    Attends Engineer, structural:  Not on file    Marital Status: Married     Review of Systems: A 12 point ROS discussed and pertinent positives are indicated in the HPI above.  All other systems are negative.  Review of Systems  Vital Signs: There were no vitals taken for this visit.  Physical Exam       Imaging: CT ABDOMEN PELVIS W CONTRAST Result Date: 07/22/2023 CLINICAL DATA:  Right lower quadrant abdominal pain for 2-3 months. Long history of abdominal surgeries and constipation. Bowel obstruction suspected. EXAM: CT ABDOMEN AND PELVIS WITH CONTRAST TECHNIQUE: Multidetector CT imaging of the abdomen and pelvis was performed using the standard protocol following bolus administration of intravenous contrast. RADIATION DOSE REDUCTION: This exam was performed according to the departmental dose-optimization program which includes automated exposure control, adjustment of the mA and/or kV according to patient size and/or use of iterative reconstruction technique. CONTRAST:  ISOVUE-300 IOPAMIDOL (ISOVUE-300) INJECTION 61% COMPARISON:  Abdominopelvic CT 12/25/2020, 09/13/2020 and 08/22/2020 FINDINGS: Lower chest: Stable mild linear atelectasis or scarring at both lung bases. No confluent airspace disease, pleural or pericardial effusion. Mild coronary artery atherosclerosis noted. Hepatobiliary: The liver is normal in density without suspicious focal abnormality. There are stable low-density cystic lesions in the right lobe. No significant biliary dilatation status post cholecystectomy. Pancreas: Unremarkable. No pancreatic ductal dilatation or surrounding inflammatory changes. Spleen: Normal in size without focal abnormality. Adrenals/Urinary Tract: Both adrenal glands appear normal. No evidence of urinary tract calculus, suspicious renal lesion or hydronephrosis. The bladder appears unremarkable for its degree of distention. Stomach/Bowel: Enteric contrast has passed into the rectum. The stomach appears unremarkable for its  degree of distension. No evidence of bowel wall thickening, distention or surrounding inflammatory change. The appendix appears normal. Scattered colonic diverticulosis, greatest within the descending and sigmoid colon. No evidence of acute bowel inflammation. Vascular/Lymphatic: There are no enlarged abdominal or pelvic lymph nodes. Aortic and branch vessel atherosclerosis without evidence of aneurysm or large vessel occlusion. Reproductive: The prostate gland and seminal vesicles appear unremarkable. Other: Patient has developed a recurrent thick walled fluid collection at the site of the previously drained collection, involving the right internal oblique musculature. This measures up to 5.0 x 6.9 cm transverse on image 53/2 and measures approximately 6.8 cm craniocaudal on sagittal image 44/6. On previous drainage, this was reported to yield  purulent fluid, and findings are suspicious for a recurrent abscess. Mild inflammatory changes within adjacent retroperitoneal fat without additional focal fluid collection. No foreign body or soft tissue emphysema. Stable postsurgical changes at the umbilicus. No ascites or pneumoperitoneum. Musculoskeletal: No acute or significant osseous findings. 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 Multilevel spondylosis. IMPRESSION: 1. Recurrent thick walled fluid collection involving the right lateral abdominal wall musculature, consistent with a recurrent abscess. 2. No evidence of bowel obstruction or acute bowel inflammation. Normal appendix. 3. Colonic diverticulosis without evidence of acute diverticulitis or bowel obstruction. 4.  Aortic Atherosclerosis (ICD10-I70.0). 5. These results will be called to the ordering clinician or representative by the Radiologist Assistant, and communication documented in the PACS or [employer]. Electronically Signed   By: Carey Bullocks M.D.   On: 07/22/2023 16:44    Labs:  CBC: Recent Labs    07/21/23 1121  WBC 9.1  HGB 13.9   HCT 40.9  PLT 277.0    COAGS: No results for input(s): "INR", "APTT" in the last 8760 hours.  BMP: Recent Labs    07/21/23 1121 07/24/23 1321  NA 133* 134*  K 4.3 4.0  CL 94* 95*  CO2 30 30  GLUCOSE 407* 226*  BUN 16 15  CALCIUM 9.1 8.8  CREATININE 0.92 0.97    LIVER FUNCTION TESTS: Recent Labs    07/21/23 1121 07/24/23 1321  BILITOT 0.7 0.6  AST 17 17  ALT 27 27  ALKPHOS 81 76  PROT 6.7 6.6  ALBUMIN 4.0 3.8    TUMOR MARKERS: No results for input(s): "AFPTM", "CEA", "CA199", "CHROMGRNA" in the last 8760 hours.  Assessment and Plan:  73 y/o M with history of umbilical hernia repair 2019, incisional hernia repair with mesh placement 2021 and abdominal wall abscess requiring aspiration and drain placement in IR 09/01/20 who presents today for aspiration/possible drain placement of recurrent right abdominal wall abscess.  Patient history and imaging reviewed by Dr. Deanne Coffer who approves patient for right abdomen wall abscess aspiration/drain placement today with Dr. Milford Cage  Risks and benefits discussed with the patient including bleeding, infection, damage to adjacent structures, bowel perforation/fistula connection, and sepsis.  All of the patient's questions were answered, patient is agreeable to proceed.  Consent signed and in chart.  Thank you for this interesting consult.  I greatly enjoyed meeting ONNIE HATCHEL Pearson and look forward to participating in their care.  A copy of this report was sent to the requesting provider on this date.  Electronically Signed: Villa Herb, PA-C 07/30/2023, 12:18 PM   I spent a total of 30 Minutes  in face to face in clinical consultation, greater than 50% of which was counseling/coordinating care for abdominal wall abscess.

## 2023-07-30 NOTE — Progress Notes (Signed)
 Chief Complaint: Recurrent abdominal wall abscess  HPI:    David Pearson is a 73 year old male with a past medical history as listed below including a previous abdominal wall abscess, known to Dr. Adela Lank, who was referred to me by McGowen, Maryjean Morn, MD for a complaint of current abdominal wall abscess.    January 2020 colonoscopy with 1 cm sessile serrated polyp, diverticulosis and hemorrhoids.    November 2021 EGD with large gastric polyp.  Biopsy showed hyperplastic polyp.  He was referred to Dr. Meridee Score for removal.    [phone] EUS with removal of hyperplastic polyp from the stomach.    08/02/2020 patient seen in clinic by Dr. Adela Lank for right lower quadrant pain.  A CT was ordered at that time.    08/23/2020 CTAP with contrast showed abdominal wall abscess 4.9 x 2.5 x 3.8 cm.  Patient referred to IR for stat fluid collection.  He had this done in April.    12/25/2020 CTAP with contrast showed interval removal of right lower quadrant percutaneous drain catheter and no residual abscess.    07/22/2023 CTAP with contrast with recurrent thick-walled fluid collection at the site of previously drained collection involving the right internal oblique musculature measuring 5 x 6.9 cm.  Normal bowel.    07/28/2023 patient seen by PCP.  At that time discussed he was on Vantin for antibiotics of recurrent abdominal wall abscess.    Today, patient presents to clinic and tells me that over the past couple of months he has noticed a growing pain in his right side similar to when he had previous abscess.  This is now sharp and stabbing in nature and quite uncomfortable.  He is currently on Cefpodoxime 200 mg twice daily and has an appointment with interventional radiology tomorrow for drainage.  Reminds me they were never really sure why this started in the first place.    Denies fever, chills, change in bowel habits or symptoms that awaken him from sleep.     Prior work-up Colonoscopy 06/12/18 - The  perianal and digital rectal examinations were normal. - Multiple medium-mouthed diverticula were found in the transverse colon, hepatic flexure and left colon. - A 10 mm polyp was found in the hepatic flexure. The polyp was sessile. The polyp was removed with a cold snare. Resection and retrieval were complete. - A 4 mm polyp was found in the hepatic flexure. The polyp was flat and snare would not grasp it. The polyp was removed with a cold biopsy forceps. Resection and retrieval were complete. - A 3 mm polyp was found in the descending colon. The polyp was sessile. The polyp was removed with a cold biopsy forceps. Resection and retrieval were complete. - Internal hemorrhoids were found during retroflexion. - The exam was otherwise without abnormality.   Surgical [P], hepatic flexure, descending, polyp (3) - SESSILE SERRATED POLYP (MULTIPLE FRAGMENTS). - HYPERPLASTIC POLYP (X2 FRAGMENTS). - NO DYSPLASIA OR MALIGNANCY.     EGD 04/20/20 - - A 1 cm hiatal hernia was present. - The exam of the esophagus was otherwise normal. - A single large pedunculated polyp with a thick stalk with stigmata of recent bleeding (ulceration) was found in the gastric antrum. Given the size of the stalk this lesion is at higher risk for bleeding with polypectomy. Biopsies were taken with a cold forceps for histology initially. The lesion was quite friable and oozed for several minutes before stopping, it was observed for several minutes. Will discuss with patient and  plan to remove this at the hospital given bleeding risk. - Multiple benign appearing small sessile polyps were found in the gastric antrum and in the prepyloric region of the stomach. Biopsies were taken with a cold forceps for histology. - The exam of the stomach was otherwise normal. - Biopsies were taken with a cold forceps in the gastric body, at the incisura and in the gastric antrum for Helicobacter pylori testing. - A single 3 mm sessile  polyp was found in the duodenal bulb. The polyp was removed with a cold biopsy forceps. Resection and retrieval were complete. - The exam of the duodenum was otherwise normal.   1. Surgical [P], duodenal polyp - PEPTIC DUODENITIS. - NO DYSPLASIA OR MALIGNANCY. 2. Surgical [P], gastric polyp - HYPERPLASTIC POLYP. - NO INTESTINAL METAPLASIA, DYSPLASIA, OR MALIGNANCY. 3. Surgical [P], gastric antrum and gastric body - REACTIVE GASTROPATHY. - FRAGMENT SUGGESTIVE OF FUNDIC GLAND POLYP. - WARTHIN-STARRY IS NEGATIVE FOR HELICOBACTER PYLORI. - NO INTESTINAL METAPLASIA, DYSPLASIA, OR MALIGNANCY.     EUS / EGD 07/26/20 - EGD Impression: - No gross lesions in esophagus. Z-line irregular, 40 cm from the incisors. - Multiple gastric polyps in antrum. - A single gastric polyp. After EUS completed, mucosal resection performed with epinephrine lift/polyloop placement and snare resection. Clips (MR conditional) were placed. - One duodenal polyp. Resected and retrieved. - No other gross lesions in the duodenal bulb, in the first portion of the duodenum and in the second portion of the duodenum. Biopsied. EUS Impression: - A lesion was found in the antrum of the stomach. The diagnosis is consistent with previously biopsied hyperplastic polyp though dysplastic changes are possible.   FINAL MICROSCOPIC DIAGNOSIS:   A. DUODENUM, BIOPSY:  - Duodenal mucosa with no significant pathologic findings.  - Negative for increased intraepithelial lymphocytes and villous  architectural changes.   B. DUODENUM, POLYPECTOMY:  - Nodular peptic duodenitis.  - Negative for dysplasia.   C. STOMACH, ENDOSCOPIC MUCOSAL RESECTION OF POLYP:  - Hyperplastic polyp.  - Negative for dysplasia.         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  PO iron x 1 yr.  Hemoccults 1/3 pos 12/2019 (when Hb and iron back to normal)->GI->gastric polyps on EGD.   Hx of adenomatous polyp of colon 2020   sessile serrated polyp x 1->recall 5 yrs (Dr. Adela Lank)   Hyperlipidemia    Intra-abdominal abscess post-procedure (HCC) 2022   not long after having incisional hernia repair-->abscess drained.  Recurrence 07/2023   Osteoarthritis of left knee    Hx of several steroid injections and these helped, meloxicam prn as of 12/2019   Sleep apnea    does not use cpap   Trigger thumb of left hand 2021/2022   inj x 3 by ortho    Past Surgical History:  Procedure Laterality Date   ABDOMINAL HERNIA REPAIR  03/12/2018   abd wall hernia repair x 2   ABDOMINOPLASTY     BIOPSY  07/26/2020   Procedure: BIOPSY;  Surgeon: Lemar Lofty., MD;  Location: [facility];  Service: Gastroenterology;;   cataracts     CHOLECYSTECTOMY N/A 03/12/2018   Procedure: LAPAROSCOPIC CHOLECYSTECTOMY;  Surgeon: Emelia Loron, MD;  Location: [facility] OR;  Service: General;  Laterality: N/A;   COLONOSCOPY  06/12/2018   sessile serrated polyp x 1, recall 2025   ENDOSCOPIC MUCOSAL RESECTION N/A 07/26/2020   Procedure: ENDOSCOPIC MUCOSAL RESECTION;  Surgeon: Lemar Lofty., MD;  Location: [facility];  Service: Gastroenterology;  Laterality: N/A;   ESOPHAGOGASTRODUODENOSCOPY (EGD) WITH PROPOFOL N/A 07/26/2020   Procedure: ESOPHAGOGASTRODUODENOSCOPY (EGD) WITH PROPOFOL;  Surgeon: Meridee Score Netty Starring., MD;  Location: [facility];  Service: Gastroenterology;  Laterality: N/A;   EUS N/A 07/26/2020   Procedure: UPPER ENDOSCOPIC ULTRASOUND (EUS) RADIAL;  Surgeon: Lemar Lofty., MD;  Location: [facility];  Service: Gastroenterology;  Laterality: N/A;   HEMOSTASIS CLIP PLACEMENT  07/26/2020   Procedure: HEMOSTASIS CLIP PLACEMENT;  Surgeon: Meridee Score Netty Starring., MD;  Location: [facility];  Service: Gastroenterology;;   HERNIA REPAIR     3 inguinal hernias   INCISIONAL HERNIA REPAIR N/A 05/18/2020   Procedure: INCISIONAL HERNIA REPAIR;  Surgeon: Emelia Loron, MD;  Location: [facility] OR;  Service: General;  Laterality: N/A;   INSERTION OF MESH N/A 05/18/2020   Procedure: INSERTION OF MESH;  Surgeon: Emelia Loron, MD;  Location: [facility] OR;  Service: General;  Laterality: N/A;   IR RADIOLOGIST EVAL & MGMT  09/13/2020   LAPAROSCOPY N/A 05/18/2020   Procedure: LAPAROSCOPY DIAGNOSTIC;  Surgeon: Emelia Loron, MD;  Location: [facility] OR;  Service: General;  Laterality: N/A;   POLYPECTOMY  07/26/2020   gastric polypectomy. Procedure: POLYPECTOMY;  Surgeon: Lemar Lofty., MD;  Location: [facility];  Service: Gastroenterology;;   Susa Day  07/26/2020   Procedure: Susa Day;  Surgeon: Lemar Lofty., MD;  Location: [facility];  Service: Gastroenterology;;   TONSILLECTOMY     with adenoids    Current Outpatient Medications  Medication Sig Dispense Refill   blood glucose meter kit and supplies KIT Check glucose every morning and every afternoon approx 2 hrs after a meal 1 each 0   cefpodoxime (VANTIN) 200 MG tablet Take 1 tablet (200 mg total) by mouth 2 (two) times daily. 28 tablet 0   glipiZIDE (GLUCOTROL) 10 MG tablet Take 1 tablet (10 mg total) by mouth 2 (two) times daily before a meal. 60 tablet 0   No current facility-administered medications for this visit.    Allergies as of 07/30/2023   (No Known Allergies)    Family History  Problem Relation Age of Onset   Breast cancer Maternal Aunt    Cancer Father    Muscular dystrophy Father    COPD Mother    COPD Sister    Colon cancer Neg Hx    Stomach cancer Neg Hx    Rectal cancer Neg Hx     Esophageal cancer Neg Hx    Colon polyps Neg Hx     Social History   Socioeconomic History   Marital status: Married    Spouse name: Not on file   Number of children: Not on file   Years of education: Not on file   Highest education level: Bachelor's degree (e.g., BA, AB, BS)  Occupational History   Not on file  Tobacco Use   Smoking status: Former    Current packs/day: 0.00    Types: Cigarettes    Start date: 2009    Quit date:  2014    Years since quitting: 11.1   Smokeless tobacco: Never  Vaping Use   Vaping status: Never Used  Substance and Sexual Activity   Alcohol use: Yes    Alcohol/week: 4.0 standard drinks of alcohol    Types: 4 Standard drinks or equivalent per week   Drug use: Not Currently   Sexual activity: Yes  Other Topics Concern   Not on file  Social History Narrative   Married, one daughter and one son.   Occup: Psychologist, educational, retired 2021.   Tobacco:quit approx 2016, 25 pack yr hx.   Alc: social         Social Drivers of Corporate investment banker Strain: Low Risk  (07/23/2023)   Overall Financial Resource Strain (CARDIA)    Difficulty of Paying Living Expenses: Not very hard  Food Insecurity: No Food Insecurity (07/23/2023)   Hunger Vital Sign    Worried About Running Out of Food in the Last Year: Never true    Ran Out of Food in the Last Year: Never true  Transportation Needs: No Transportation Needs (07/23/2023)   PRAPARE - Administrator, Civil Service (Medical): No    Lack of Transportation (Non-Medical): No  Physical Activity: Insufficiently Active (05/29/2022)   Exercise Vital Sign    Days of Exercise per Week: 2 days    Minutes of Exercise per Session: 30 min  Stress: No Stress Concern Present (07/23/2023)   [employer] of Occupational Health - Occupational Stress Questionnaire    Feeling of Stress : Only a little  Social Connections: Unknown (07/23/2023)   Social Connection and Isolation Panel [NHANES]    Frequency  of Communication with Friends and Family: Twice a week    Frequency of Social Gatherings with Friends and Family: Patient declined    Attends Religious Services: Never    Diplomatic Services operational officer: No    Attends Engineer, structural: Not on file    Marital Status: Married  Catering manager Violence: Not on file    Review of Systems:    Constitutional: No weight loss, fever or chills Cardiovascular: No chest pain, chest pressure or palpitations   Respiratory: No SOB or cough Gastrointestinal: See HPI and otherwise negative   Physical Exam:  Vital signs: BP 104/68   Pulse 97   Ht 5\' 11"  (1.803 m)   Wt 238 lb (108 kg)   BMI 33.19 kg/m    Constitutional:   Pleasant Caucasian male appears to be in NAD, Well developed, Well nourished, alert and cooperative Head:  Normocephalic and atraumatic. Eyes:   PEERL, EOMI. No icterus. Conjunctiva pink. Ears:  Normal auditory acuity. Neck:  Supple Throat: Oral cavity and pharynx without inflammation, swelling or lesion.  Respiratory: Respirations even and unlabored. Lungs clear to auscultation bilaterally.   No wheezes, crackles, or rhonchi.  Cardiovascular: Normal S1, S2. No MRG. Regular rate and rhythm. No peripheral edema, cyanosis or pallor.  Gastrointestinal:  Soft, nondistended, + hard feeling large masslike area at least 6 cm x 4 cm in the right lower quadrant very tender to palpation with involuntary guarding, normal bowel sounds. No appreciable masses or hepatomegaly. Rectal:  Not performed.  Msk:  Symmetrical without gross deformities. Without edema, no deformity or joint abnormality.  Neurologic:  Alert and  oriented x4;  grossly normal neurologically.  Skin:   Dry and intact without significant lesions or rashes. Psychiatric: Demonstrates good judgement and reason without abnormal affect or behaviors.  RELEVANT LABS AND IMAGING: CBC    Component Value Date/Time   WBC 9.1 07/21/2023 1121   RBC 4.82  07/21/2023 1121   HGB 13.9 07/21/2023 1121   HCT 40.9 07/21/2023 1121   PLT 277.0 07/21/2023 1121   MCV 84.8 07/21/2023 1121   MCH 29.1 05/18/2020 0616   MCHC 34.0 07/21/2023 1121   RDW 14.0 07/21/2023 1121   LYMPHSABS 1.5 07/21/2023 1121   MONOABS 0.7 07/21/2023 1121   EOSABS 0.1 07/21/2023 1121   BASOSABS 0.0 07/21/2023 1121    CMP     Component Value Date/Time   NA 134 (L) 07/24/2023 1321   K 4.0 07/24/2023 1321   CL 95 (L) 07/24/2023 1321   CO2 30 07/24/2023 1321   GLUCOSE 226 (H) 07/24/2023 1321   BUN 15 07/24/2023 1321   CREATININE 0.97 07/24/2023 1321   CREATININE 0.94 11/04/2018 0809   CALCIUM 8.8 07/24/2023 1321   PROT 6.6 07/24/2023 1321   ALBUMIN 3.8 07/24/2023 1321   AST 17 07/24/2023 1321   ALT 27 07/24/2023 1321   ALKPHOS 76 07/24/2023 1321   BILITOT 0.6 07/24/2023 1321   GFRNONAA >60 05/18/2020 0616   GFRNONAA 84 11/04/2018 0809   GFRAA 98 11/04/2018 0809    Assessment: 1.  Recurrent abdominal wall abscess: Currently on antibiotics and scheduled for CT drainage tomorrow, last colonoscopy in 2020, will discuss repeating with Dr. Adela Lank  Plan: 1.  Continue antibiotics and proceed with CT-guided drainage tomorrow. 2.  We will send another referral to the surgical team to take a look at this recurrent abdominal wall abscess.  Discussed this with the patient today. 3.  Will discuss possible colonoscopy updating with Dr. Adela Lank. 4.  Patient to follow in clinic with [place] per recommendations.  Hyacinth Meeker, PA-C [facility] 07/30/2023, 1:28 PM  Cc: McGowen, Maryjean Morn, MD

## 2023-07-31 ENCOUNTER — Other Ambulatory Visit (HOSPITAL_COMMUNITY): Payer: Self-pay

## 2023-07-31 ENCOUNTER — Ambulatory Visit (HOSPITAL_COMMUNITY)
Admission: RE | Admit: 2023-07-31 | Discharge: 2023-07-31 | Disposition: A | Discharge status: Home / Self Care | Payer: Medicare HMO | Source: Ambulatory Visit | Attending: Gastroenterology | Admitting: Gastroenterology

## 2023-07-31 ENCOUNTER — Other Ambulatory Visit: Payer: Self-pay

## 2023-07-31 DIAGNOSIS — L02211 Cutaneous abscess of abdominal wall: Secondary | ICD-10-CM | POA: Diagnosis not present

## 2023-07-31 DIAGNOSIS — R188 Other ascites: Secondary | ICD-10-CM | POA: Diagnosis not present

## 2023-07-31 LAB — CBC
HCT: 40.8 % (ref 39.0–52.0)
Hemoglobin: 13.5 g/dL (ref 13.0–17.0)
MCH: 27.8 pg (ref 26.0–34.0)
MCHC: 33.1 g/dL (ref 30.0–36.0)
MCV: 84 fL (ref 80.0–100.0)
Platelets: 432 10*3/uL — ABNORMAL HIGH (ref 150–400)
RBC: 4.86 MIL/uL (ref 4.22–5.81)
RDW: 13.1 % (ref 11.5–15.5)
WBC: 9.9 10*3/uL (ref 4.0–10.5)
nRBC: 0 % (ref 0.0–0.2)

## 2023-07-31 LAB — PROTIME-INR
INR: 1.3 — ABNORMAL HIGH (ref 0.8–1.2)
Prothrombin Time: 16.2 s — ABNORMAL HIGH (ref 11.4–15.2)

## 2023-07-31 LAB — GLUCOSE, CAPILLARY
Glucose-Capillary: 142 mg/dL — ABNORMAL HIGH (ref 70–99)
Glucose-Capillary: 126 mg/dL — ABNORMAL HIGH (ref 70–99)

## 2023-07-31 MED ORDER — SODIUM CHLORIDE 0.9% FLUSH: 5.0000 mL | Freq: Three times a day (TID) | INTRAVENOUS | Status: DC

## 2023-07-31 MED ORDER — FENTANYL CITRATE (PF) 100 MCG/2ML IJ SOLN
INTRAMUSCULAR | Status: AC | PRN
  Administered 2023-07-31 (×2): 25 ug via INTRAVENOUS

## 2023-07-31 MED ORDER — LIDOCAINE HCL 1 % IJ SOLN
10.0000 mL | Freq: Once | INTRAMUSCULAR | Status: AC
  Administered 2023-07-31: 10 mL via INTRADERMAL

## 2023-07-31 MED ORDER — MIDAZOLAM HCL 2 MG/2ML IJ SOLN
INTRAMUSCULAR | Status: AC | PRN
  Administered 2023-07-31: 1 mg via INTRAVENOUS

## 2023-07-31 MED ORDER — MIDAZOLAM HCL 2 MG/2ML IJ SOLN
INTRAMUSCULAR | Status: AC
  Filled 2023-07-31: qty 2

## 2023-07-31 MED ORDER — SALINE FLUSH 0.9 % IV SOLN
Freq: Every day | INTRAVENOUS | 3 refills | Status: DC
  Filled 2023-07-31: qty 300, 30d supply, fill #0

## 2023-07-31 MED ORDER — SODIUM CHLORIDE 0.9 % IV SOLN: INTRAVENOUS | Status: DC

## 2023-07-31 MED ORDER — FENTANYL CITRATE (PF) 100 MCG/2ML IJ SOLN
INTRAMUSCULAR | Status: AC
  Filled 2023-07-31: qty 2

## 2023-07-31 NOTE — Discharge Instructions (Addendum)
 Interventional Radiology Percutaneous Abscess Drain Placement After Care   This sheet gives you information about how to care for yourself after your procedure. Your health care provider may also give you more specific instructions. Your drain was placed by an interventional radiologist with [facility]. If you have questions or concerns, contact [facility] at [phone].   What is a percutaneous drain?   A drain is a small plastic tube (catheter) that goes into the fluid collection in your body through your skin.   How long will I need the drain?   How long the drain needs to stay in is determined by where the drain is, how much comes out of the drain each day and if you are having any other surgical procedures.   Interventional radiology will determine when it is time to remove the drain. It is important to follow up as directed so that the drain can be removed as soon as it is safe to do so.   What can I expect after the procedure?   After the procedure, it is common to have:   A small amount of bruising and discomfort in the area where the drainage tube (catheter) was placed.   Sleepiness and fatigue. This should go away after the medicines you were given have worn off.   Follow these instructions at home:   Insertion site care   Check your insertion site when you change the bandage. Check for:   More redness, swelling, or pain.   More fluid or blood.   Warmth.   Pus or a bad smell.   When caring for your insertion site:   Wash your hands with soap and water for at least 20 seconds before and after you change your bandage (dressing). If soap and water are not available, use hand sanitizer.   You do not need to change your dressing everyday if it is clean and dry. Change your dressing every 3 days or as needed when it is soiled, wet or becoming dislodged. You will need to change your dressing each time you shower.   Leave stitches (sutures), skin  glue, or adhesive strips in place. These skin closures may need to stay in place for 2 weeks or longer. If adhesive strip edges start to loosen and curl up, you may trim the loose edges. Do not remove adhesive strips completely unless your health care provider tells you to do so.   Catheter care   Flush the catheter once per day with 5 mL of 0.9% normal saline unless you are told otherwise by your healthcare provider. This helps to prevent clogs in the catheter.   To disconnect the drain, turn the clear plastic tube to the left. Attach the saline syringe by placing it on the white end of the drain and turning gently to the right. Once attached gently push the plunger to the 5 mL mark. After you are done flushing, disconnect the syringe by turning to the left and reattach your drainage container   If you have a bulb please be sure the bulb is charged after reconnecting it - to do this pinch the bulb between your thumb and first finger and close the stopper located on the top of the bulb.    Check for fluid leaking from around your catheter (instead of fluid draining through your catheter). This may be a sign that the drain is no longer working correctly.   Write down the following information every time you empty your  bag:   The date and time.   The amount of drainage.   Activity   Rest at home for 1-2 days after your procedure.   For the first 48 hours do not lift anything more than 10 lbs (about a gallon of milk). You may perform moderate activities/exercise. Please avoid strenuous activities during this time.   Avoid any activities which may pull on your drain as this can cause your drain to become dislodged.   If you were given a sedative during the procedure, it can affect you for several hours. Do not drive or operate machinery until your health care provider says that it is safe.   General instructions   For mild pain take over-the-counter medications as needed for pain such as  Tylenol or Advil. If you are experiencing severe pain please call our office as this may indicate an issue with your drain.    If you were prescribed an antibiotic medicine, take it as told by your health care provider. Do not stop using the antibiotic even if you start to feel better.   You may shower 24 hours after the drain is placed. To do this cover the insertion site with a water tight material such as saran wrap and seal the edges with tape, you may also purchase waterproof dressings at your local drug store. Shower as usual and then remove the water tight dressing and any gauze/tape underneath it once you have exited the shower and dried off. Allow the area to air dry or pat dry with a clean towel. Once the skin is completely dry place a new gauze dressing. It is important to keep the site dry at all times to prevent infection.   Do not submerge the drain - this means you cannot take baths, swim, use a hot tub, etc. until the drain is removed.    Do not use any products that contain nicotine or tobacco, such as cigarettes, e-cigarettes, and chewing tobacco. If you need help quitting, ask your health care provider.   Keep all follow-up visits as told by your health care provider. This is important.   Contact a health care provider if:   You have less than 10 mL of drainage a day for 2-3 days in a row, or as directed by your health care provider.   You have any of these signs of infection:   More redness, swelling, or pain around your incision area.   More fluid or blood coming from your incision area.   Warmth coming from your incision area.   Pus or a bad smell coming from your incision area.   You have fluid leaking from around your catheter (instead of through your catheter).   You are unable to flush the drain.   You have a fever or chills.   You have pain that does not get better with medicine.   You have not been contacted to schedule a drain follow up appointment  within 10 days of discharge from the hospital.   Please call [facility] at [phone] with any questions or concerns.   Get help right away if:   Your catheter comes out.   You suddenly stop having drainage from your catheter.   You suddenly have blood in the fluid that is draining from your catheter.   You become dizzy or you faint.   You develop a rash.   You have nausea or vomiting.   You have difficulty breathing or you feel short  of breath.   You develop chest pain.   You have problems with your speech or vision.   You have trouble balancing or moving your arms or legs.   Summary   It is common to have a small amount of bruising and discomfort in the area where the drainage tube (catheter) was placed. You may also have minor discomfort with movement while the drain is in place.   Flush the drain once per day with 5 mL of 0.9% normal saline (unless you were told otherwise by your healthcare provider).    Record the amount of drainage from the bag every time you empty it.   Change the dressing every 3 days or earlier if soiled/wet. Keep the skin dry under the dressing.   You may shower with the drain in place. Do not submerge the drain (no baths, swimming, hot tubs, etc.).   Contact [facility] at [phone] if you have more redness, swelling, or pain around your incision area or if you have pain that does not get better with medicine.   This information is not intended to replace advice given to you by your health care provider. Make sure you discuss any questions you have with your health care provider.   Document Revised: 08/23/2021 Document Reviewed: 05/15/2019   Elsevier Patient Education  2023 [employer]vier Inc[employer]         Interventional Radiology Drain Record   Empty your drain at least once per day. You may empty it as often as needed. Use this form to write down the amount of fluid that has collected in the drainage container. Bring  this form with you to your follow-up visits. Please call [facility] at [phone] with any questions or concerns prior to your appointment.   Drain #1 location: ___________________   Date __________ Time __________ Amount __________   Date __________ Time __________ Amount __________   Date __________ Time __________ Amount __________   Date __________ Time __________ Amount __________   Date __________ Time __________ Amount __________   Date __________ Time __________ Amount __________   Date __________ Time __________ Amount __________   Date __________ Time __________ Amount __________   Date __________ Time __________ Amount __________   Date __________ Time __________ Amount __________   Date __________ Time __________ Amount __________   Date __________ Time __________ Amount __________   Date __________ Time __________ Amount __________   Date __________ Time __________ Amount __________

## 2023-07-31 NOTE — Procedures (Signed)
 Vascular and Interventional Radiology Procedure Note  Patient: David Pearson DOB: 10-23-1950 Medical Record Number: [id number] Note Date/Time: 07/31/23 8:37 AM   Performing Physician: Roanna Banning, MD Assistant(s): None  Diagnosis: R abd wall abscess  Procedure: DRAINAGE CATHETER PLACEMENT into RIGHT ABDOMINAL WALL ABSCESS  Anesthesia: Conscious Sedation Complications: None Estimated Blood Loss: Minimal Specimens: Sent for Gram Stain, Aerobe Culture, and Anerobe Culture  Findings:  Successful CT-guided placement of 10 F catheter into R abd wall abscess.  Plan:  - Flush drain with 5 mL Normal Saline every 12 hours. - Follow up drain evaluation / sinogram in 10 day(s).  See detailed procedure note with images in PACS. The patient tolerated the procedure well without incident or complication and was returned to Recovery in stable condition.    Roanna Banning, MD [facility]   Pager. [phone] Clinic. [phone]

## 2023-08-01 ENCOUNTER — Other Ambulatory Visit: Payer: Self-pay | Admitting: Gastroenterology

## 2023-08-01 DIAGNOSIS — L02211 Cutaneous abscess of abdominal wall: Secondary | ICD-10-CM

## 2023-08-05 LAB — AEROBIC/ANAEROBIC CULTURE W GRAM STAIN (SURGICAL/DEEP WOUND): Culture: NO GROWTH

## 2023-08-06 ENCOUNTER — Telehealth: Payer: Self-pay | Admitting: Family Medicine

## 2023-08-06 NOTE — Telephone Encounter (Signed)
 Pls call and check on clinical status of patient. He got his abscess drained 2/27. Does he still have a drain in? Does he have a f/u appt with interventional radiology?

## 2023-08-07 NOTE — Telephone Encounter (Signed)
 Pt still has drain in and was supposed to follow up in 10 days but currently scheduled for 3/17 due to current availability.

## 2023-08-15 ENCOUNTER — Other Ambulatory Visit: Payer: Self-pay | Admitting: Family Medicine

## 2023-08-18 ENCOUNTER — Ambulatory Visit
Admission: RE | Admit: 2023-08-18 | Discharge: 2023-08-18 | Disposition: A | Discharge status: Home / Self Care | Payer: Medicare HMO | Source: Ambulatory Visit | Attending: Gastroenterology

## 2023-08-18 ENCOUNTER — Inpatient Hospital Stay: Admission: RE | Admit: 2023-08-18 | Payer: Medicare HMO | Source: Ambulatory Visit

## 2023-08-18 ENCOUNTER — Inpatient Hospital Stay
Admission: RE | Admit: 2023-08-18 | Discharge: 2023-08-18 | Disposition: A | Discharge status: Home / Self Care | Payer: Medicare HMO | Source: Ambulatory Visit | Attending: Physician Assistant | Admitting: Physician Assistant

## 2023-08-18 ENCOUNTER — Other Ambulatory Visit: Payer: Medicare HMO

## 2023-08-18 DIAGNOSIS — Z4803 Encounter for change or removal of drains: Secondary | ICD-10-CM | POA: Diagnosis not present

## 2023-08-18 DIAGNOSIS — L02211 Cutaneous abscess of abdominal wall: Secondary | ICD-10-CM

## 2023-08-18 DIAGNOSIS — Z978 Presence of other specified devices: Secondary | ICD-10-CM | POA: Diagnosis not present

## 2023-08-18 DIAGNOSIS — K5730 Diverticulosis of large intestine without perforation or abscess without bleeding: Secondary | ICD-10-CM | POA: Diagnosis not present

## 2023-08-18 MED ORDER — IOPAMIDOL (ISOVUE-300) INJECTION 61%
100.0000 mL | Freq: Once | INTRAVENOUS | Status: AC | PRN
  Administered 2023-08-18: 100 mL via INTRAVENOUS

## 2023-08-20 ENCOUNTER — Other Ambulatory Visit

## 2023-08-22 ENCOUNTER — Ambulatory Visit: Payer: Managed Care, Other (non HMO) | Admitting: Gastroenterology

## 2023-08-22 NOTE — Progress Notes (Signed)
 Patient is scheduled with Dr Andrey Campanile at [facility] on 09/10/23.

## 2023-08-25 ENCOUNTER — Telehealth: Payer: Self-pay | Admitting: *Deleted

## 2023-08-25 DIAGNOSIS — L02211 Cutaneous abscess of abdominal wall: Secondary | ICD-10-CM

## 2023-08-25 DIAGNOSIS — Z860100 Personal history of colon polyps, unspecified: Secondary | ICD-10-CM

## 2023-08-25 MED ORDER — NA SULFATE-K SULFATE-MG SULF 17.5-3.13-1.6 GM/177ML PO SOLN: ORAL | 0 refills | Status: DC

## 2023-08-25 NOTE — Telephone Encounter (Signed)
-----   Message from Benancio Deeds sent at 08/25/2023  1:13 PM EDT ----- Regarding: FW: New Patient Referral, just want to make sure Dr Andrey Campanile is okay seeing pt? Can someone help schedule this patient for colonoscopy at the [facility]? I am having him see Dr. Dwain Sarna and you can let him know that Dr. Dwain Sarna would like an up-to-date colonoscopy for him.  He is actually overdue for his history of polyps.  Can book him at the [facility], would wait for another 6 weeks at least to allow his abdomen to heal post drainage.  Thanks  Dr. Mervyn Skeeters ----- Message ----- From: Emelia Loron, MD Sent: 08/25/2023   8:07 AM EDT To: Gaynelle Adu, MD; Benancio Deeds, MD Subject: RE: New Patient Referral, just want to make #  Happy to see him. Just tell Alisha. This may very well, be related to a number of things in including the abdominoplasty/drain/lap chole.   Brett Canales looks like last scope was before all this?  I suspect low yield but might be beneficial to prove its negative Matt ----- Message ----- From: Benancio Deeds, MD Sent: 08/25/2023   7:31 AM EDT To: Emelia Loron, MD; Gaynelle Adu, MD; # Subject: RE: New Patient Referral, just want to make #  Thanks Minerva Areola / Susy Frizzle. Yes this is a pretty unusual case. Whatever you think is best.  Thanks, Brett Canales ----- Message ----- From: Gaynelle Adu, MD Sent: 08/22/2023   3:56 PM EDT To: Emelia Loron, MD; Ethlyn Gallery; # Subject: RE: New Patient Referral, just want to make #  Toniann Fail  I'm wondering if this pt needs to see Dr Dwain Sarna.  Dr Dwain Sarna did lap subtotal cholecystectomy on pt in 2019 followed by umbilical incisional hernia repair in 2021.  I wondering if this recurrent right lateral abdominal wall abscess (2022 & 2025) is where perhaps his postop gallbladder surgical drain was located and came thru the skin since there was gallstone spillage in 2019 and pus during surgery if tract was seeded perhaps.  It appears abscess is not involving any  bowel and just involving abd wall on right side/flank. Grew E coli.in past.  That's the only thing I can think of why this guy may have a right sided recurring abd wall abscess - weird - but that seems most likely when I review his chart.   Thanks Andrey Campanile ----- Message ----- From: Reinaldo Meeker Sent: 08/22/2023   2:18 PM EDT To: Gaynelle Adu, MD Subject: New Patient Referral, just want to make sure#  Dr Andrey Campanile,  [facility] Desk Staff received a call from this patient requesting an appt. Appt given with Dr Andrey Campanile on 09/10/23. 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  Just wanted to make sure this is okay?   Thank you,  Toniann Fail

## 2023-08-25 NOTE — Telephone Encounter (Signed)
 Spoke to patient to advise of need for colonoscopy as requested by Dr Dwain Sarna and for follow up of colon polyps. Patient has scheduled colonoscopy in [facility] on 10/20/23 at 830 am. Patient has been advised of time/date/location for upcoming procedure and has been given advised instructions are available for review in mychart. He is asked to call with questions or concerns.  Patient states that he is currently still scheduled with Dr Andrey Campanile on 09/10/23 and he is advised that he will likely be getting a call to rearrange his visit so he can see Dr Dwain Sarna who did his previous surgery and is familiar with him.

## 2023-09-01 ENCOUNTER — Encounter: Payer: Self-pay | Admitting: Gastroenterology

## 2023-09-13 ENCOUNTER — Other Ambulatory Visit: Payer: Self-pay | Admitting: Family Medicine

## 2023-09-30 DIAGNOSIS — L02211 Cutaneous abscess of abdominal wall: Secondary | ICD-10-CM | POA: Diagnosis not present

## 2023-10-02 DIAGNOSIS — E119 Type 2 diabetes mellitus without complications: Secondary | ICD-10-CM | POA: Diagnosis not present

## 2023-10-02 DIAGNOSIS — Z961 Presence of intraocular lens: Secondary | ICD-10-CM | POA: Diagnosis not present

## 2023-10-02 DIAGNOSIS — Z7984 Long term (current) use of oral hypoglycemic drugs: Secondary | ICD-10-CM | POA: Diagnosis not present

## 2023-10-02 LAB — HM DIABETES EYE EXAM

## 2023-10-13 ENCOUNTER — Encounter: Payer: Self-pay | Admitting: Gastroenterology

## 2023-10-13 ENCOUNTER — Encounter (HOSPITAL_COMMUNITY): Payer: Self-pay

## 2023-10-20 ENCOUNTER — Ambulatory Visit: Admitting: Gastroenterology

## 2023-10-20 ENCOUNTER — Encounter: Payer: Self-pay | Admitting: Gastroenterology

## 2023-10-20 VITALS — BP 118/82 | HR 68 | Temp 97.7°F | Resp 12 | Ht 71.0 in | Wt 238.0 lb

## 2023-10-20 DIAGNOSIS — Z1211 Encounter for screening for malignant neoplasm of colon: Secondary | ICD-10-CM | POA: Diagnosis not present

## 2023-10-20 DIAGNOSIS — Z860101 Personal history of adenomatous and serrated colon polyps: Secondary | ICD-10-CM | POA: Diagnosis not present

## 2023-10-20 DIAGNOSIS — R933 Abnormal findings on diagnostic imaging of other parts of digestive tract: Secondary | ICD-10-CM | POA: Diagnosis not present

## 2023-10-20 DIAGNOSIS — K648 Other hemorrhoids: Secondary | ICD-10-CM

## 2023-10-20 DIAGNOSIS — K5730 Diverticulosis of large intestine without perforation or abscess without bleeding: Secondary | ICD-10-CM

## 2023-10-20 DIAGNOSIS — Z860100 Personal history of colon polyps, unspecified: Secondary | ICD-10-CM

## 2023-10-20 DIAGNOSIS — D128 Benign neoplasm of rectum: Secondary | ICD-10-CM | POA: Diagnosis not present

## 2023-10-20 DIAGNOSIS — E119 Type 2 diabetes mellitus without complications: Secondary | ICD-10-CM | POA: Diagnosis not present

## 2023-10-20 DIAGNOSIS — G4730 Sleep apnea, unspecified: Secondary | ICD-10-CM | POA: Diagnosis not present

## 2023-10-20 MED ORDER — SODIUM CHLORIDE 0.9 % IV SOLN: 500.0000 mL | INTRAVENOUS | Status: DC

## 2023-10-20 NOTE — Progress Notes (Signed)
 To pacu, VSS. Report to [email or web address].tb

## 2023-10-20 NOTE — Op Note (Signed)
 [facility] Patient Name: David Pearson Procedure Date: 10/20/2023 8:38 AM MRN: [id number] Endoscopist: David Pinion P. 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 The patient's tolerance of                            previous anesthesia was also reviewed. The risks                            and benefits of the procedure and the sedation                            options and risks were discussed with the patient.                            All questions were answered, and informed consent                            was obtained. Prior Anticoagulants: The patient has                            taken no anticoagulant or antiplatelet agents. ASA                            Grade Assessment: II - A patient with mild systemic                            disease. After reviewing the risks and benefits,  the patient was deemed in satisfactory condition to                            undergo the procedure.                           After obtaining informed consent, the colonoscope                            was passed under direct vision. Throughout the                            procedure, the patient's blood pressure, pulse, and                            oxygen saturations were  monitored continuously. The                            CF [id number] #[id number] was introduced through the anus                            and advanced to the the cecum, identified by                            appendiceal orifice and ileocecal valve. The                            colonoscopy was performed without difficulty. The                            patient tolerated the procedure well. The quality                            of the bowel preparation was adequate. The                            ileocecal valve, appendiceal orifice, and rectum                            were photographed. Scope In: 9:04:25 AM Scope Out: 9:24:34 AM Scope Withdrawal Time: 0 hours 14 minutes 9 seconds  Total Procedure Duration: 0 hours 20 minutes 9 seconds  Findings:                 The perianal and digital rectal examinations were                            normal.                           Multiple diverticula were found in the transverse                            colon, hepatic flexure and left colon.  A 6 mm polyp was found in the proximal rectum. The                            polyp was sessile. The polyp was removed with a                            cold snare. Resection and retrieval were complete.                           Internal hemorrhoids were found during retroflexion.                           The exam was otherwise without abnormality. Complications:            No immediate complications. Estimated blood loss:                            Minimal. Estimated Blood Loss:     Estimated blood loss was minimal. Impression:               - Diverticulosis in the transverse colon, at the                            hepatic flexure and in the left colon.                           - One 6 mm polyp in the proximal rectum, removed                            with a cold snare. Resected and retrieved.                           - Internal hemorrhoids.                           - The  examination was otherwise normal. Recommendation:           - Patient has a contact number available for                            emergencies. The signs and symptoms of potential                            delayed complications were discussed with the                            patient. Return to normal activities tomorrow.                            Written discharge instructions were provided to the                            patient.                           -  Resume previous diet.                           - Continue present medications.                           - Await pathology results.                           - Follow up with general surgery in regards to                            recurrent abdominal wall abscess. There was no                            evidence of diverticulitis on CTs when this was                            found. David Pinion P. Pharrah Rottman, MD 10/20/2023 9:31:23 AM This report has been signed electronically.

## 2023-10-20 NOTE — Progress Notes (Signed)
 [facility] History and Physical   Primary Care Physician:  Shelvia Dick, MD   Reason for Procedure:   History of colon polyps, abnormal imaging GI tract - recurrent abdominal wall abscess unclear etiology  Plan:    colonoscopy     HPI: David Pearson is a 73 y.o. male  here for colonoscopy surveillance - 1 cm SSP removed 06/2018. 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 Pending surgery to evaluate this, which has since healed. History of diverticulosis.   Patient denies any bowel symptoms at this time.  Otherwise feels well without any cardiopulmonary symptoms.   I have discussed risks / benefits of anesthesia and endoscopic procedure with David Pearson and they wish to proceed with the exams as outlined today.    Past Medical History:  Diagnosis Date   Abdominal aortic ectasia (HCC) 05/15/2018   AAA screening 05/2018, due for repeat US  in 5 years 05/2023   Cataract    COVID-19    Diabetes mellitus without complication (HCC)    type 2, no meds   Diverticulosis    on colonoscopy 2020   ED (erectile dysfunction)    Gastric polyps    GERD (gastroesophageal reflux disease)    Hearing loss 02/25/2019   bilateral - [facility]   History of iron  deficiency anemia    2020.  PO iron  x 1 yr.  Hemoccults 1/3 pos 12/2019 (when Hb and iron  back to normal)->GI->gastric polyps on EGD.   Hx of adenomatous polyp of colon 2020   sessile serrated polyp x 1->recall 5 yrs (Dr. General Kenner)   Hyperlipidemia    Intra-abdominal abscess post-procedure (HCC) 2022   not long after having incisional hernia repair-->abscess drained.  Recurrence 07/2023   Osteoarthritis of left knee    Hx of several steroid injections and these helped, meloxicam prn as of 12/2019   Sleep apnea    does not use cpap   Trigger thumb of left hand 2021/2022   inj x 3 by ortho    Past Surgical History:  Procedure Laterality Date   ABDOMINAL HERNIA REPAIR   03/12/2018   abd wall hernia repair x 2   ABDOMINOPLASTY     BIOPSY  07/26/2020   Procedure: BIOPSY;  Surgeon: Normie Becton., MD;  Location: [facility];  Service: Gastroenterology;;   cataracts     CHOLECYSTECTOMY N/A 03/12/2018   Procedure: LAPAROSCOPIC CHOLECYSTECTOMY;  Surgeon: Enid Harry, MD;  [facility] OR;  Service: General;  Laterality: N/A;   COLONOSCOPY  06/12/2018   sessile serrated polyp x 1, recall 2025   ENDOSCOPIC MUCOSAL RESECTION N/A 07/26/2020   Procedure: ENDOSCOPIC MUCOSAL RESECTION;  Surgeon: Normie Becton., MD;  Location: [facility];  Service: Gastroenterology;  Laterality: N/A;   ESOPHAGOGASTRODUODENOSCOPY (EGD) WITH PROPOFOL  N/A 07/26/2020   Procedure: ESOPHAGOGASTRODUODENOSCOPY (EGD) WITH PROPOFOL ;  Surgeon: Brice Campi Albino Alu., MD;  Location: [facility];  Service: Gastroenterology;  Laterality: N/A;   EUS N/A 07/26/2020   Procedure: UPPER ENDOSCOPIC ULTRASOUND (EUS) RADIAL;  Surgeon: Normie Becton., MD;  Location: [facility];  Service: Gastroenterology;  Laterality: N/A;   HEMOSTASIS CLIP PLACEMENT  07/26/2020   Procedure: HEMOSTASIS CLIP PLACEMENT;  Surgeon: Brice Campi Albino Alu., MD;  Location: [facility];  Service: Gastroenterology;;   HERNIA REPAIR     3 inguinal hernias   INCISIONAL HERNIA REPAIR N/A 05/18/2020   Procedure: INCISIONAL HERNIA REPAIR;  Surgeon: Enid Harry, MD;  Location: [facility] OR;  Service:  General;  Laterality: N/A;   INSERTION OF MESH N/A 05/18/2020   Procedure: INSERTION OF MESH;  Surgeon: Enid Harry, MD;  Location: [facility] OR;  Service: General;  Laterality: N/A;   IR RADIOLOGIST EVAL & MGMT  09/13/2020   LAPAROSCOPY N/A 05/18/2020   Procedure: LAPAROSCOPY DIAGNOSTIC;  Surgeon: Enid Harry, MD;  Location: [facility] OR;  Service: General;  Laterality: N/A;   POLYPECTOMY  07/26/2020   gastric polypectomy. Procedure: POLYPECTOMY;  Surgeon: Brice Campi Albino Alu., MD;  Location: [facility];  Service: Gastroenterology;;   Daryle Eon  07/26/2020   Procedure: Daryle Eon;  Surgeon: Mansouraty, Albino Alu., MD;  Location: [facility];  Service: Gastroenterology;;   TONSILLECTOMY     with adenoids    Prior to Admission medications   Medication Sig Start Date End Date Taking? Authorizing Provider  blood glucose meter kit and supplies KIT Check glucose every morning and every afternoon approx 2 hrs after a meal 07/24/23  Yes McGowen, Minetta Aly, MD  glipiZIDE  (GLUCOTROL ) 10 MG tablet TAKE 1 TABLET (10 MG TOTAL) BY MOUTH TWICE A DAY BEFORE A MEAL 08/15/23  Yes McGowen, Minetta Aly, MD    Current Outpatient Medications  Medication Sig Dispense Refill   blood glucose meter kit and supplies KIT Check glucose every morning and every afternoon approx 2 hrs after a meal 1 each 0   glipiZIDE  (GLUCOTROL ) 10 MG tablet TAKE 1 TABLET (10 MG TOTAL) BY MOUTH TWICE A DAY BEFORE A MEAL 60 tablet 1   Current Facility-Administered Medications  Medication Dose Route Frequency Provider Last Rate Last Admin   0.9 %  sodium chloride  infusion  500 mL Intravenous Continuous Verna Hamon, Lendon Queen, MD        Allergies as of 10/20/2023   (No Known Allergies)    Family History  Problem Relation Age of Onset   Breast cancer Maternal Aunt    Cancer Father    Muscular dystrophy Father    COPD Mother    COPD Sister    Colon cancer Neg Hx    Stomach cancer Neg Hx    Rectal cancer Neg Hx    Esophageal cancer Neg Hx    Colon polyps Neg Hx     Social History   Socioeconomic History   Marital status: Married    Spouse name: Not on file   Number of children: Not on file   Years of education: Not on file   Highest education level: Bachelor's degree (e.g., BA, AB, BS)  Occupational History   Not on file  Tobacco Use   Smoking status: Former    Current packs/day: 0.00    Types: Cigarettes    Start date: 2009    Quit date: 2014    Years since quitting: 11.3   Smokeless tobacco: Never   Vaping Use   Vaping status: Never Used  Substance and Sexual Activity   Alcohol use: Yes    Alcohol/week: 4.0 standard drinks of alcohol    Types: 4 Standard drinks or equivalent per week   Drug use: Not Currently   Sexual activity: Yes  Other Topics Concern   Not on file  Social History Narrative   Married, one daughter and one son.   Occup: Psychologist, educational, retired 2021.   Tobacco:quit approx 2016, 25 pack yr hx.   Alc: social         Social Drivers of Corporate investment banker Strain: Low Risk  (07/23/2023)   Overall Financial Resource Strain (CARDIA)  Difficulty of Paying Living Expenses: Not very hard  Food Insecurity: No Food Insecurity (07/23/2023)   Hunger Vital Sign    Worried About Running Out of Food in the Last Year: Never true    Ran Out of Food in the Last Year: Never true  Transportation Needs: No Transportation Needs (07/23/2023)   PRAPARE - Administrator, Civil Service (Medical): No    Lack of Transportation (Non-Medical): No  Physical Activity: Insufficiently Active (05/29/2022)   Exercise Vital Sign    Days of Exercise per Week: 2 days    Minutes of Exercise per Session: 30 min  Stress: No Stress Concern Present (07/23/2023)   [employer] of Occupational Health - Occupational Stress Questionnaire    Feeling of Stress : Only a little  Social Connections: Unknown (07/23/2023)   Social Connection and Isolation Panel [NHANES]    Frequency of Communication with Friends and Family: Twice a week    Frequency of Social Gatherings with Friends and Family: Patient declined    Attends Religious Services: Never    Database administrator or Organizations: No    Attends Engineer, structural: Not on file    Marital Status: Married  Catering manager Violence: Not on file    Review of Systems: All other review of systems negative except as mentioned in the HPI.  Physical Exam: Vital signs BP (!) 131/90   Pulse 83   Temp 97.7 F (36.5  C)   Ht 5\' 11"  (1.803 m)   Wt 238 lb (108 kg)   SpO2 97%   BMI 33.19 kg/m   General:   Alert,  Well-developed, pleasant and cooperative in NAD Lungs:  Clear throughout to auscultation.   Heart:  Regular rate and rhythm Abdomen:  Soft, nontender and nondistended.   Neuro/Psych:  Alert and cooperative. Normal mood and affect. A and O x 3  Christi Coward, MD [facility]

## 2023-10-20 NOTE — Progress Notes (Signed)
 Pt's states no medical or surgical changes since previsit or office visit.

## 2023-10-20 NOTE — Patient Instructions (Signed)

## 2023-10-20 NOTE — Progress Notes (Signed)
 Called to room to assist during endoscopic procedure.  Patient ID and intended procedure confirmed with present staff. Received instructions for my participation in the procedure from the performing physician.

## 2023-10-21 ENCOUNTER — Telehealth: Payer: Self-pay | Admitting: *Deleted

## 2023-10-21 ENCOUNTER — Other Ambulatory Visit: Payer: Self-pay | Admitting: Family Medicine

## 2023-10-21 NOTE — Telephone Encounter (Signed)
  Follow up Call-     10/20/2023    8:31 AM  Call back number  Post procedure Call Back phone  # [phone]  Permission to leave phone message Yes     Patient questions:  Do you have a fever, pain , or abdominal swelling? No. Pain Score  0 *  Have you tolerated food without any problems? Yes.    Have you been able to return to your normal activities? Yes.    Do you have any questions about your discharge instructions: Diet   No. Medications  No. Follow up visit  No.  Do you have questions or concerns about your Care? No.  Actions: * If pain score is 4 or above: No action needed, pain <4.

## 2023-10-22 ENCOUNTER — Ambulatory Visit: Payer: Self-pay | Admitting: Gastroenterology

## 2023-10-22 LAB — SURGICAL PATHOLOGY

## 2023-12-20 ENCOUNTER — Other Ambulatory Visit: Payer: Self-pay | Admitting: Family Medicine

## 2023-12-22 ENCOUNTER — Other Ambulatory Visit: Payer: Self-pay | Admitting: Family Medicine

## 2023-12-22 NOTE — Telephone Encounter (Signed)
 FYI Only or Action Required?: Action required by provider: medication refill request.  Patient was last seen in primary care on 07/28/2023 by McGowen, Aleene DEL, MD.  Called Nurse Triage reporting No chief complaint on file..  Symptoms began today.  Interventions attempted: Nothing.  Symptoms are: stable.  Triage Disposition: No disposition on file.  Patient/caregiver understands and will follow disposition?:

## 2023-12-22 NOTE — Telephone Encounter (Signed)
 Copied from CRM [id number]. Topic: Clinical - Medication Refill >> Dec 22, 2023  8:51 AM Rosina BIRCH wrote: Medication: glipiZIDE  (GLUCOTROL ) 10 MG tablet Patient wants to see if he can get a three month supply and he wants time release tablets Patient stated the pharmacy tried to contact the office but there has been no response   Has the patient contacted their pharmacy? Yes (Agent: If no, request that the patient contact the pharmacy for the refill. If patient does not wish to contact the pharmacy document the reason why and proceed with request.) (Agent: If yes, when and what did the pharmacy advise?)  This is the patient's preferred pharmacy:  [id number] - [place], [place] - [address] [place] [address] Phone: [phone] Fax: [phone]  Is this the correct pharmacy for this prescription? Yes If no, delete pharmacy and type the correct one.   Has the prescription been filled recently? Yes  Is the patient out of the medication? Yes  Has the patient been seen for an appointment in the last year OR does the patient have an upcoming appointment? Yes  Can we respond through MyChart? Yes  Agent: Please be advised that Rx refills may take up to 3 business days. We ask that you follow-up with your pharmacy.

## 2024-01-21 ENCOUNTER — Ambulatory Visit (INDEPENDENT_AMBULATORY_CARE_PROVIDER_SITE_OTHER): Admitting: Family Medicine

## 2024-01-21 ENCOUNTER — Encounter: Payer: Self-pay | Admitting: Family Medicine

## 2024-01-21 VITALS — BP 117/75 | HR 80 | Temp 98.2°F | Ht 71.0 in | Wt 241.2 lb

## 2024-01-21 DIAGNOSIS — E118 Type 2 diabetes mellitus with unspecified complications: Secondary | ICD-10-CM

## 2024-01-21 DIAGNOSIS — Z0000 Encounter for general adult medical examination without abnormal findings: Secondary | ICD-10-CM

## 2024-01-21 DIAGNOSIS — Z7984 Long term (current) use of oral hypoglycemic drugs: Secondary | ICD-10-CM | POA: Diagnosis not present

## 2024-01-21 DIAGNOSIS — Z125 Encounter for screening for malignant neoplasm of prostate: Secondary | ICD-10-CM

## 2024-01-21 DIAGNOSIS — E119 Type 2 diabetes mellitus without complications: Secondary | ICD-10-CM | POA: Diagnosis not present

## 2024-01-21 LAB — LIPID PANEL
Cholesterol: 222 mg/dL — ABNORMAL HIGH (ref 0–200)
HDL: 40.5 mg/dL (ref >39.00)
LDL Cholesterol: 140 mg/dL — ABNORMAL HIGH (ref 0–99)
NonHDL: 181.19
Total CHOL/HDL Ratio: 5
Triglycerides: 208 mg/dL — ABNORMAL HIGH (ref 0.0–149.0)
VLDL: 41.6 mg/dL — ABNORMAL HIGH (ref 0.0–40.0)

## 2024-01-21 LAB — COMPREHENSIVE METABOLIC PANEL WITH GFR
ALT: 32 U/L (ref 0–53)
AST: 23 U/L (ref 0–37)
Albumin: 4.6 g/dL (ref 3.5–5.2)
Alkaline Phosphatase: 66 U/L (ref 39–117)
BUN: 21 mg/dL (ref 6–23)
CO2: 30 meq/L (ref 19–32)
Calcium: 9.5 mg/dL (ref 8.4–10.5)
Chloride: 103 meq/L (ref 96–112)
Creatinine, Ser: 0.84 mg/dL (ref 0.40–1.50)
GFR: 87.35 mL/min (ref >60.00)
Glucose, Bld: 103 mg/dL — ABNORMAL HIGH (ref 70–99)
Potassium: 4.6 meq/L (ref 3.5–5.1)
Sodium: 138 meq/L (ref 135–145)
Total Bilirubin: 0.5 mg/dL (ref 0.2–1.2)
Total Protein: 7 g/dL (ref 6.0–8.3)

## 2024-01-21 LAB — POCT GLYCOSYLATED HEMOGLOBIN (HGB A1C)
HbA1c POC (<> result, manual entry): 5.9 % (ref 4.0–5.6)
HbA1c, POC (controlled diabetic range): 5.9 % (ref 0.0–7.0)
HbA1c, POC (prediabetic range): 5.9 % (ref 5.7–6.4)
Hemoglobin A1C: 5.9 % — AB (ref 4.0–5.6)

## 2024-01-21 LAB — PSA, MEDICARE: PSA: 0.91 ng/mL (ref 0.10–4.00)

## 2024-01-21 MED ORDER — GLIPIZIDE 10 MG PO TABS: 10.0000 mg | ORAL_TABLET | Freq: Two times a day (BID) | ORAL | 3 refills | Status: AC

## 2024-01-21 NOTE — Progress Notes (Signed)
 Office Note 01/21/2024  CC:  Chief Complaint  Patient presents with   Medical Management of Chronic Issues    Pt is not fasting   HPI:  Patient is a 73 y.o. male who is here for annual health maintenance exam and follow-up diabetes. David Pearson feels well. Right abdominal wall abscess recurrence was treated successfully with drainage. He saw surgeon and if it recurs the surgeon recommended operative debridement. A colonoscopy about 3 months 10/20/2023 showed diverticulosis and 1 adenomatous polyp.  Occasional glucose monitoring ranges anywhere from 75-250. He takes glipizide  10 mg twice a day. He does not eat a diabetic diet but he does inquire about information on this.  Past Medical History:  Diagnosis Date   Abdominal aortic ectasia (HCC) 05/15/2018   AAA screening 05/2018, due for repeat US  in 5 years 05/2023   Cataract 2015   Surgery completed   COVID-19    Diabetes mellitus without complication (HCC)    type 2, no meds   Diverticulosis    on colonoscopy 2020   ED (erectile dysfunction)    Gastric polyps    GERD (gastroesophageal reflux disease) 1995   Hearing loss 02/25/2019   bilateral - [facility]   History of iron  deficiency anemia    2020.  PO iron  x 1 yr.  Hemoccults 1/3 pos 12/2019 (when Hb and iron  back to normal)->GI->gastric polyps on EGD.   Hx of adenomatous polyp of colon 2020   sessile serrated polyp x 1->recall 5 yrs (Dr. Leigh)   Hyperlipidemia    Intra-abdominal abscess post-procedure (HCC) 2022   not long after having incisional hernia repair-->abscess drained.  Recurrence 07/2023   Osteoarthritis of left knee    Hx of several steroid injections and these helped, meloxicam prn as of 12/2019   Sleep apnea    does not use cpap   Trigger thumb of left hand 2021/2022   inj x 3 by ortho    Past Surgical History:  Procedure Laterality Date   ABDOMINAL HERNIA REPAIR  03/12/2018   abd wall hernia repair x 2   ABDOMINOPLASTY     BIOPSY  07/26/2020    Procedure: BIOPSY;  Surgeon: Wilhelmenia Aloha Raddle., MD;  Location: [facility];  Service: Gastroenterology;;   cataracts     CHOLECYSTECTOMY N/A 03/12/2018   Procedure: LAPAROSCOPIC CHOLECYSTECTOMY;  Surgeon: Ebbie Cough, MD;  Location: [facility] OR;  Service: General;  Laterality: N/A;   COLONOSCOPY  06/12/2018   sessile serrated polyp x 1, recall 2025   COSMETIC SURGERY  Abdomalplasty   ENDOSCOPIC MUCOSAL RESECTION N/A 07/26/2020   Procedure: ENDOSCOPIC MUCOSAL RESECTION;  Surgeon: Wilhelmenia Aloha Raddle., MD;  Location: [facility];  Service: Gastroenterology;  Laterality: N/A;   ESOPHAGOGASTRODUODENOSCOPY (EGD) WITH PROPOFOL  N/A 07/26/2020   Procedure: ESOPHAGOGASTRODUODENOSCOPY (EGD) WITH PROPOFOL ;  Surgeon: Wilhelmenia Aloha Raddle., MD;  Location: [facility];  Service: Gastroenterology;  Laterality: N/A;   EUS N/A 07/26/2020   Procedure: UPPER ENDOSCOPIC ULTRASOUND (EUS) RADIAL;  Surgeon: Wilhelmenia Aloha Raddle., MD;  Location: [facility];  Service: Gastroenterology;  Laterality: N/A;   EYE SURGERY  2015   Cataract/lens replacement   HEMOSTASIS CLIP PLACEMENT  07/26/2020   Procedure: HEMOSTASIS CLIP PLACEMENT;  Surgeon: Wilhelmenia Aloha Raddle., MD;  Location: [facility];  Service: Gastroenterology;;   HERNIA REPAIR     3 inguinal hernias   INCISIONAL HERNIA REPAIR N/A 05/18/2020   Procedure: INCISIONAL HERNIA REPAIR;  Surgeon: Ebbie Cough, MD;  Location: [facility] OR;  Service: General;  Laterality: N/A;   INSERTION  OF MESH N/A 05/18/2020   Procedure: INSERTION OF MESH;  Surgeon: Ebbie Cough, MD;  Location: [facility] OR;  Service: General;  Laterality: N/A;   IR RADIOLOGIST EVAL & MGMT  09/13/2020   LAPAROSCOPY N/A 05/18/2020   Procedure: LAPAROSCOPY DIAGNOSTIC;  Surgeon: Ebbie Cough, MD;  Location: [facility] OR;  Service: General;  Laterality: N/A;   POLYPECTOMY  07/26/2020   gastric polypectomy. Procedure: POLYPECTOMY;  Surgeon: Mansouraty, Aloha Raddle., MD;  Location: [facility];  Service: Gastroenterology;;   MATIAS  07/26/2020   Procedure: MATIAS;  Surgeon: Mansouraty, Aloha Raddle., MD;  Location: [facility];  Service: Gastroenterology;;   TONSILLECTOMY     with adenoids    Family History  Problem Relation Age of Onset   Breast cancer Maternal Aunt    Cancer Father    Muscular dystrophy Father    COPD Mother    COPD Sister    Colon cancer Neg Hx    Stomach cancer Neg Hx    Rectal cancer Neg Hx    Esophageal cancer Neg Hx    Colon polyps Neg Hx     Social History   Socioeconomic History   Marital status: Married    Spouse name: Not on file   Number of children: Not on file   Years of education: Not on file   Highest education level: Bachelor's degree (e.g., BA, AB, BS)  Occupational History   Not on file  Tobacco Use   Smoking status: Former    Current packs/day: 0.00    Types: Cigarettes    Start date: 2009    Quit date: 2014    Years since quitting: 11.6   Smokeless tobacco: Never  Vaping Use   Vaping status: Never Used  Substance and Sexual Activity   Alcohol use: Yes    Alcohol/week: 4.0 standard drinks of alcohol    Types: 4 Standard drinks or equivalent per week   Drug use: Not Currently   Sexual activity: Yes  Other Topics Concern   Not on file  Social History Narrative   Married, one daughter and one son.   Occup: Psychologist, educational, retired 2021.   Tobacco:quit approx 2016, 25 pack yr hx.   Alc: social         Social Drivers of Corporate investment banker Strain: Low Risk  (07/23/2023)   Overall Financial Resource Strain (CARDIA)    Difficulty of Paying Living Expenses: Not very hard  Food Insecurity: No Food Insecurity (07/23/2023)   Hunger Vital Sign    Worried About Running Out of Food in the Last Year: Never true    Ran Out of Food in the Last Year: Never true  Transportation Needs: No Transportation Needs (07/23/2023)   PRAPARE - Administrator, Civil Service (Medical): No    Lack  of Transportation (Non-Medical): No  Physical Activity: Insufficiently Active (05/29/2022)   Exercise Vital Sign    Days of Exercise per Week: 2 days    Minutes of Exercise per Session: 30 min  Stress: No Stress Concern Present (07/23/2023)   [employer] of Occupational Health - Occupational Stress Questionnaire    Feeling of Stress : Only a little  Social Connections: Unknown (07/23/2023)   Social Connection and Isolation Panel    Frequency of Communication with Friends and Family: Twice a week    Frequency of Social Gatherings with Friends and Family: Patient declined    Attends Religious Services: Never    Database administrator or  Organizations: No    Attends Engineer, structural: Not on file    Marital Status: Married  Catering manager Violence: Not on file    Outpatient Medications Prior to Visit  Medication Sig Dispense Refill   blood glucose meter kit and supplies KIT Check glucose every morning and every afternoon approx 2 hrs after a meal 1 each 0   glipiZIDE  (GLUCOTROL ) 10 MG tablet TAKE 1 TABLET (10 MG TOTAL) BY MOUTH TWICE A DAY BEFORE A MEAL 60 tablet 0   No facility-administered medications prior to visit.    No Known Allergies  Review of Systems  Constitutional:  Negative for appetite change, chills, fatigue and fever.  HENT:  Negative for congestion, dental problem, ear pain and sore throat.   Eyes:  Negative for discharge, redness and visual disturbance.  Respiratory:  Negative for cough, chest tightness, shortness of breath and wheezing.   Cardiovascular:  Negative for chest pain, palpitations and leg swelling.  Gastrointestinal:  Negative for abdominal pain, blood in stool, diarrhea, nausea and vomiting.  Genitourinary:  Negative for difficulty urinating, dysuria, flank pain, frequency, hematuria and urgency.  Musculoskeletal:  Negative for arthralgias, back pain, joint swelling, myalgias and neck stiffness.  Skin:  Negative for pallor and rash.   Neurological:  Negative for dizziness, speech difficulty, weakness and headaches.  Hematological:  Negative for adenopathy. Does not bruise/bleed easily.  Psychiatric/Behavioral:  Negative for confusion and sleep disturbance. The patient is not nervous/anxious.     PE;    01/21/2024   10:58 AM 10/20/2023    9:50 AM 10/20/2023    9:40 AM  Vitals with BMI  Height 5' 11    Weight 241 lbs 3 oz    BMI 33.66    Systolic 117 118 883  Diastolic 75 82 72  Pulse 80 68 70   Gen: Alert, well appearing.  Patient is oriented to person, place, time, and situation. AFFECT: pleasant, lucid thought and speech. ENT: Ears: EACs clear, normal epithelium.  TMs with good light reflex and landmarks bilaterally.  Eyes: no injection, icteris, swelling, or exudate.  EOMI, PERRLA. Nose: no drainage or turbinate edema/swelling.  No injection or focal lesion.  Mouth: lips without lesion/swelling.  Oral mucosa pink and moist.  Dentition intact and without obvious caries or gingival swelling.  Oropharynx without erythema, exudate, or swelling.  Neck: supple/nontender.  No LAD, mass, or TM.  Carotid pulses 2+ bilaterally, without bruits. CV: RRR, no m/r/g.   LUNGS: CTA bilat, nonlabored resps, good aeration in all lung fields. ABD: soft, NT, ND, BS normal.  No hepatospenomegaly or mass.  No bruits. EXT: no clubbing, cyanosis, or edema.  Musculoskeletal: no joint swelling, erythema, warmth, or tenderness.  ROM of all joints intact. Skin - no sores or suspicious lesions or rashes or color changes  Pertinent labs:   Lab Results  Component Value Date   WBC 9.9 07/31/2023   HGB 13.5 07/31/2023   HCT 40.8 07/31/2023   MCV 84.0 07/31/2023   PLT 432 (H) 07/31/2023   Lab Results  Component Value Date   IRON  131 03/15/2021   TIBC 366 03/15/2021   FERRITIN 48 03/15/2021    Lab Results  Component Value Date   CREATININE 0.97 07/24/2023   BUN 15 07/24/2023   NA 134 (L) 07/24/2023   K 4.0 07/24/2023   CL 95  (L) 07/24/2023   CO2 30 07/24/2023   Lab Results  Component Value Date   ALT 27 07/24/2023  AST 17 07/24/2023   ALKPHOS 76 07/24/2023   BILITOT 0.6 07/24/2023   Lab Results  Component Value Date   CHOL 144 03/15/2021   Lab Results  Component Value Date   HDL 39.20 03/15/2021   Lab Results  Component Value Date   LDLCALC 76 03/20/2020   Lab Results  Component Value Date   TRIG 201.0 (H) 03/15/2021   Lab Results  Component Value Date   CHOLHDL 4 03/15/2021   Lab Results  Component Value Date   PSA 0.70 03/15/2021   PSA 0.61 12/15/2019   Lab Results  Component Value Date   HGBA1C 5.9 (A) 01/21/2024   HGBA1C 5.9 01/21/2024   HGBA1C 5.9 01/21/2024   HGBA1C 5.9 01/21/2024   ASSESSMENT AND PLAN:   #1 health maintenance exam: Reviewed age and gender appropriate health maintenance issues (prudent diet, regular exercise, health risks of tobacco and excessive alcohol, use of seatbelts, fire alarms in home, use of sunscreen).  Also reviewed age and gender appropriate health screening as well as vaccine recommendations. Vaccines: Prevnar 20->declines.  Shingrix->declines. Labs: Lipid panel, complete metabolic panel, PSA.  Point-of-care hemoglobin A1c. Prostate ca screening: PSA today Colon ca screening: History of adenomatous polyp, next colonoscopy due in May 2030.  #2 diabetes without complication, good control on glipizide  10 mg twice daily. Point-of-care hemoglobin A1c today is 5.9%.  An After Visit Summary was printed and given to the patient.  FOLLOW UP:  No follow-ups on file.  Signed:  Gerlene Hockey, MD           01/21/2024

## 2024-01-22 ENCOUNTER — Ambulatory Visit: Payer: Self-pay | Admitting: Family Medicine

## 2024-02-23 NOTE — Progress Notes (Signed)
 KALMEN LOLLAR III                                          MRN: [id number]   02/23/2024   The [facility] Quality Team Specialist reviewed this patient medical record for the purposes of chart review for care gap closure. The following were reviewed: abstraction for care gap closure-glycemic status assessment.    [facility] Quality Team

## 2024-03-01 ENCOUNTER — Ambulatory Visit: Admitting: Dietician

## 2024-03-08 NOTE — Progress Notes (Signed)
 Diabetes Self-Management Education  Visit Type: First/Initial  Appt. Start Time: 0800 Appt. End Time: 0836  03/19/2024  [name], identified by name and date of birth, is a 73 y.o. male with a diagnosis of Diabetes: Type 2.   ASSESSMENT  Patient is here today alone. Patient would like to learn more about eating healthy for diabetes and promote weight reductions to a lower BMI range. Patient lives with his wife and Pt does the shopping and cooking. Pt reports testing blood sugar 1-2 daily stating am and pm stating recent values ranging 79-200 mg/dL. Pt reports no symptoms reported with high or low blood sugars. Pt reports an interest in weekly GLP medications to assist with weight reductions and was encouraged to discuss further with referring provider. All Pt's questions were answered during this encounter.    History includes:   Past Medical History:  Diagnosis Date   Abdominal aortic ectasia 05/15/2018   AAA screening 05/2018, due for repeat US  in 5 years 05/2023   Abdominal wall abscess    not long after having incisional hernia repair-->abscess drained. Recurrence 07/2023->drained.   Cataract 2015   Surgery completed   COVID-19    Diabetes mellitus without complication (HCC)    type 2, no meds   Diverticulosis    on colonoscopy 2020   ED (erectile dysfunction)    Gastric polyps    GERD (gastroesophageal reflux disease) 1995   Hearing loss 02/25/2019   bilateral - [facility]   History of iron  deficiency anemia    2020.  PO iron  x 1 yr.  Hemoccults 1/3 pos 12/2019 (when Hb and iron  back to normal)->GI->gastric polyps on EGD.   Hx of adenomatous polyp of colon 2020   sessile serrated polyp x 1->recall 5 yrs (Dr. Leigh)   Hyperlipidemia    Intra-abdominal abscess post-procedure (HCC) 2022   not long after having incisional hernia repair-->abscess drained.  Recurrence 07/2023   Osteoarthritis of left knee    Hx of several steroid injections and these helped,  meloxicam prn as of 12/2019   Sleep apnea    does not use cpap   Trigger thumb of left hand 2021/2022   inj x 3 by ortho    Medications include:   Current Outpatient Medications:    blood glucose meter kit and supplies KIT, Check glucose every morning and every afternoon approx 2 hrs after a meal, Disp: 1 each, Rfl: 0   glipiZIDE  (GLUCOTROL ) 10 MG tablet, Take 1 tablet (10 mg total) by mouth 2 (two) times daily before a meal., Disp: 180 tablet, Rfl: 3  Labs noted:   Lab Results  Component Value Date   HGBA1C 5.9 (A) 01/21/2024   HGBA1C 5.9 01/21/2024   HGBA1C 5.9 01/21/2024   HGBA1C 5.9 01/21/2024   Weight 242 lb (109.8 kg). Body mass index is 33.75 kg/m.  Wt Readings from Last 3 Encounters:  03/19/24 242 lb (109.8 kg)  01/21/24 241 lb 3.2 oz (109.4 kg)  10/20/23 238 lb (108 kg)    Diabetes Self-Management Education - 03/19/24 0757       Visit Information   Visit Type First/Initial      Initial Visit   Diabetes Type Type 2    Date Diagnosed ?    Are you currently following a meal plan? No      Health Coping   How would you rate your overall health? Good   dont have expectations     Psychosocial Assessment   What is  the hardest part about your diabetes right now, causing you the most concern, or is the most worrisome to you about your diabetes?   Making healty food and beverage choices    Self-care barriers None    Self-management support Doctor's office    Other persons present Patient    Patient Concerns Nutrition/Meal planning;Healthy Lifestyle;Weight Control;Glycemic Control    Special Needs None    Preferred Learning Style No preference indicated    Learning Readiness Contemplating    How often do you need to have someone help you when you read instructions, pamphlets, or other written materials from your doctor or pharmacy? 1 - Never    What is the last grade level you completed in school? BS      Pre-Education Assessment   Patient understands the diabetes  disease and treatment process. Needs Instruction    Patient understands incorporating nutritional management into lifestyle. Needs Instruction    Patient undertands incorporating physical activity into lifestyle. Needs Instruction    Patient understands using medications safely. Needs Instruction    Patient understands monitoring blood glucose, interpreting and using results Needs Instruction    Patient understands prevention, detection, and treatment of acute complications. Needs Instruction    Patient understands prevention, detection, and treatment of chronic complications. Needs Instruction    Patient understands how to develop strategies to address psychosocial issues. Needs Instruction    Patient understands how to develop strategies to promote health/change behavior. Needs Instruction      Complications   Last HgB A1C per patient/outside source 5.9 %    How often do you check your blood sugar? 1-2 times/day    Fasting Blood glucose range (mg/dL) 819-799;29-870;869-820    Postprandial Blood glucose range (mg/dL) 819-799;29-870;869-820    Number of hyperglycemic episodes ( >200mg /dL): Occasional    Can you tell when your blood sugar is high? No    Have you had a dilated eye exam in the past 12 months? Yes    Have you had a dental exam in the past 12 months? Yes    Are you checking your feet? Yes    How many days per week are you checking your feet? 1   Pt instructed to check feet daily     Dietary Intake   Breakfast ~ 1 cup raisin bran, whole milk, diet pepsi, mineral water    Snack (morning) none    Lunch buffalo chicken wrap with lettuce or ham or peanut butter and banana sandwich on wheat or sourdough, ice tea unsweet    Snack (afternoon) 2-3pm: cheese, almonds apple    Dinner cheese, 2 slices of wheat bread or chicken or pork, brussles prouts, cauliflower, green beans sald with dressing, water    Snack (evening) none    Beverage(s) whole milk, diet pepsi, mineral water, water,  ice tea unsweet      Activity / Exercise   Activity / Exercise Type Moderate (swimming / aerobic walking)    How many days per week do you exercise? 3    How many minutes per day do you exercise? 30    Total minutes per week of exercise 90      Patient Education   Previous Diabetes Education No    Disease Pathophysiology Definition of diabetes, type 1 and 2, and the diagnosis of diabetes    Healthy Eating Plate Method;Reviewed blood glucose goals for pre and post meals and how to evaluate the patients' food intake on their blood glucose level.;Role of diet  in the treatment of diabetes and the relationship between the three main macronutrients and blood glucose level;Food label reading, portion sizes and measuring food.;Meal timing in regards to the patients' current diabetes medication.    Being Active Role of exercise on diabetes management, blood pressure control and cardiac health.    Medications Reviewed patients medication for diabetes, action, purpose, timing of dose and side effects.    Monitoring Identified appropriate SMBG and/or A1C goals.;Purpose and frequency of SMBG.;Daily foot exams    Acute complications Taught prevention, symptoms, and  treatment of hypoglycemia - the 15 rule.    Chronic complications Relationship between chronic complications and blood glucose control;Identified and discussed with patient  current chronic complications    Diabetes Stress and Support Worked with patient to identify barriers to care and solutions    Lifestyle and Health Coping Lifestyle issues that need to be addressed for better diabetes care      Individualized Goals (developed by patient)   Nutrition Follow meal plan discussed    Physical Activity Exercise 3-5 times per week;30 minutes per day    Medications take my medication as prescribed    Monitoring  Test my blood glucose as discussed    Problem Solving Eating Pattern    Reducing Risk examine blood glucose patterns;do foot checks  daily;treat hypoglycemia with 15 grams of carbs if blood glucose less than 70mg /dL    Health Coping Ask for help with psychological, social, or emotional issues      Post-Education Assessment   Patient understands the diabetes disease and treatment process. Needs Review    Patient understands incorporating nutritional management into lifestyle. Needs Review    Patient undertands incorporating physical activity into lifestyle. Comprehends key points    Patient understands using medications safely. Needs Review    Patient understands monitoring blood glucose, interpreting and using results Comprehends key points    Patient understands prevention, detection, and treatment of acute complications. Comprehends key points    Patient understands prevention, detection, and treatment of chronic complications. Comprehends key points    Patient understands how to develop strategies to address psychosocial issues. Needs Review    Patient understands how to develop strategies to promote health/change behavior. Needs Review      Outcomes   Expected Outcomes Demonstrated interest in learning. Expect positive outcomes    Future DMSE 3-4 months    Program Status Not Completed          Individualized Plan for Diabetes Self-Management Training:   Learning Objective:  Patient will have a greater understanding of diabetes self-management. Patient education plan is to attend individual and/or group sessions per assessed needs and concerns.   Plan:   There are no Patient Instructions on file for this visit.  Expected Outcomes:  Demonstrated interest in learning. Expect positive outcomes  Education material provided: ADA - How to Thrive: A Guide for Your Journey with Diabetes, My Plate, Snack sheet, Support group flyer, and Diabetes Resources  If problems or questions, patient to contact team via:  Phone  Future DSME appointment: 3-4 months

## 2024-03-19 ENCOUNTER — Encounter: Attending: Family Medicine | Admitting: Dietician

## 2024-03-19 VITALS — Wt 242.0 lb

## 2024-03-19 DIAGNOSIS — E118 Type 2 diabetes mellitus with unspecified complications: Secondary | ICD-10-CM | POA: Insufficient documentation

## 2024-06-11 ENCOUNTER — Encounter: Payer: Self-pay | Admitting: Family Medicine

## 2024-06-11 ENCOUNTER — Ambulatory Visit: Admitting: Family Medicine

## 2024-06-11 VITALS — BP 143/81 | HR 70 | Temp 97.3°F | Wt 251.8 lb

## 2024-06-11 DIAGNOSIS — E7800 Pure hypercholesterolemia, unspecified: Secondary | ICD-10-CM | POA: Diagnosis not present

## 2024-06-11 DIAGNOSIS — R635 Abnormal weight gain: Secondary | ICD-10-CM | POA: Diagnosis not present

## 2024-06-11 DIAGNOSIS — Z7984 Long term (current) use of oral hypoglycemic drugs: Secondary | ICD-10-CM

## 2024-06-11 DIAGNOSIS — E119 Type 2 diabetes mellitus without complications: Secondary | ICD-10-CM | POA: Diagnosis not present

## 2024-06-11 LAB — POCT GLYCOSYLATED HEMOGLOBIN (HGB A1C)
HbA1c POC (<> result, manual entry): 6.8 %
HbA1c, POC (controlled diabetic range): 6.8 % (ref 0.0–7.0)
HbA1c, POC (prediabetic range): 6.8 % — AB (ref 5.7–6.4)
Hemoglobin A1C: 6.8 % — AB (ref 4.0–5.6)

## 2024-06-11 MED ORDER — TIRZEPATIDE 2.5 MG/0.5ML ~~LOC~~ SOAJ: 2.5000 mg | SUBCUTANEOUS | 0 refills | Status: DC

## 2024-06-11 NOTE — Progress Notes (Signed)
 OFFICE VISIT  06/11/2024  CC:  Chief Complaint  Patient presents with   Medical Management of Chronic Issues    Glucose 111; pt request Mounjaro      Patient is a 74 y.o. male who presents for 90-month follow-up diabetes. A/P as of last visit: Diabetes without complication, good control on glipizide  10 mg twice daily. Point-of-care hemoglobin A1c today is 5.9%.  INTERIM HX: David Pearson is frustrated with gaining weight despite eating a very good diabetic diet and exercising at the gym regularly for the last several months. Glucoses ranging anywhere from 90-160 in the mornings.  He no longer checks it in the evenings. No hypoglycemia.  He is interested in a GLP-1  Past Medical History:  Diagnosis Date   Abdominal aortic ectasia 05/15/2018   AAA screening 05/2018, due for repeat US  in 5 years 05/2023   Abdominal wall abscess    not long after having incisional hernia repair-->abscess drained. Recurrence 07/2023->drained.   Cataract 2015   Surgery completed   COVID-19    Diabetes mellitus without complication (HCC)    type 2, no meds   Diverticulosis    on colonoscopy 2020   ED (erectile dysfunction)    Gastric polyps    GERD (gastroesophageal reflux disease) 1995   Hearing loss 02/25/2019   bilateral - [facility]   History of iron  deficiency anemia    2020.  PO iron  x 1 yr.  Hemoccults 1/3 pos 12/2019 (when Hb and iron  back to normal)->GI->gastric polyps on EGD.   Hx of adenomatous polyp of colon 2020   sessile serrated polyp x 1->recall 5 yrs (Dr. Leigh)   Hyperlipidemia    Intra-abdominal abscess post-procedure (HCC) 2022   not long after having incisional hernia repair-->abscess drained.  Recurrence 07/2023   Osteoarthritis of left knee    Hx of several steroid injections and these helped, meloxicam prn as of 12/2019   Sleep apnea    does not use cpap   Trigger thumb of left hand 2021/2022   inj x 3 by ortho    Past Surgical History:  Procedure Laterality Date    ABDOMINAL HERNIA REPAIR  03/12/2018   abd wall hernia repair x 2   ABDOMINOPLASTY     BIOPSY  07/26/2020   Procedure: BIOPSY;  Surgeon: Wilhelmenia Aloha Raddle., MD;  Location: [facility];  Service: Gastroenterology;;   cataracts     CHOLECYSTECTOMY N/A 03/12/2018   Procedure: LAPAROSCOPIC CHOLECYSTECTOMY;  Surgeon: Ebbie Cough, MD;  Location: [facility] OR;  Service: General;  Laterality: N/A;   COLONOSCOPY  06/12/2018   2020 sessile serrated polyp.  10/2023 adenoma x 1-->recall 5 yrs.   COSMETIC SURGERY  Abdomalplasty   ENDOSCOPIC MUCOSAL RESECTION N/A 07/26/2020   Procedure: ENDOSCOPIC MUCOSAL RESECTION;  Surgeon: Wilhelmenia Aloha Raddle., MD;  Location: [facility];  Service: Gastroenterology;  Laterality: N/A;   ESOPHAGOGASTRODUODENOSCOPY (EGD) WITH PROPOFOL  N/A 07/26/2020   Procedure: ESOPHAGOGASTRODUODENOSCOPY (EGD) WITH PROPOFOL ;  Surgeon: Wilhelmenia Aloha Raddle., MD;  Location: [facility];  Service: Gastroenterology;  Laterality: N/A;   EUS N/A 07/26/2020   Procedure: UPPER ENDOSCOPIC ULTRASOUND (EUS) RADIAL;  Surgeon: Wilhelmenia Aloha Raddle., MD;  Location: [facility];  Service: Gastroenterology;  Laterality: N/A;   EYE SURGERY  2015   Cataract/lens replacement   HEMOSTASIS CLIP PLACEMENT  07/26/2020   Procedure: HEMOSTASIS CLIP PLACEMENT;  Surgeon: Wilhelmenia Aloha Raddle., MD;  Location: [facility];  Service: Gastroenterology;;   HERNIA REPAIR     3 inguinal hernias   INCISIONAL HERNIA REPAIR N/A  05/18/2020   Procedure: INCISIONAL HERNIA REPAIR;  Surgeon: Ebbie Cough, MD;  Location: [facility] OR;  Service: General;  Laterality: N/A;   INSERTION OF MESH N/A 05/18/2020   Procedure: INSERTION OF MESH;  Surgeon: Ebbie Cough, MD;  Location: [facility] OR;  Service: General;  Laterality: N/A;   IR RADIOLOGIST EVAL & MGMT  09/13/2020   LAPAROSCOPY N/A 05/18/2020   Procedure: LAPAROSCOPY DIAGNOSTIC;  Surgeon: Ebbie Cough, MD;  Location: [facility] OR;  Service: General;   Laterality: N/A;   POLYPECTOMY  07/26/2020   gastric polypectomy. Procedure: POLYPECTOMY;  Surgeon: Wilhelmenia Aloha Raddle., MD;  Location: [facility];  Service: Gastroenterology;;   MATIAS  07/26/2020   Procedure: MATIAS;  Surgeon: Wilhelmenia Aloha Raddle., MD;  Location: [facility];  Service: Gastroenterology;;   TONSILLECTOMY     with adenoids    Outpatient Medications Prior to Visit  Medication Sig Dispense Refill   blood glucose meter kit and supplies KIT Check glucose every morning and every afternoon approx 2 hrs after a meal 1 each 0   glipiZIDE  (GLUCOTROL ) 10 MG tablet Take 1 tablet (10 mg total) by mouth 2 (two) times daily before a meal. 180 tablet 3   No facility-administered medications prior to visit.    Allergies[1]  Review of Systems As per HPI  PE:    06/11/2024    2:45 PM 03/19/2024    8:40 AM 01/21/2024   10:58 AM  Vitals with BMI  Height   5' 11  Weight 251 lbs 13 oz 242 lbs 241 lbs 3 oz  BMI   33.66  Systolic 143  117  Diastolic 81  75  Pulse 70  80     Physical Exam  Gen: Alert, well appearing.  Patient is oriented to person, place, time, and situation. AFFECT: pleasant, lucid thought and speech. No further exam today  LABS:  Last CBC Lab Results  Component Value Date   WBC 9.9 07/31/2023   HGB 13.5 07/31/2023   HCT 40.8 07/31/2023   MCV 84.0 07/31/2023   MCH 27.8 07/31/2023   RDW 13.1 07/31/2023   PLT 432 (H) 07/31/2023   Last metabolic panel Lab Results  Component Value Date   GLUCOSE 103 (H) 01/21/2024   NA 138 01/21/2024   K 4.6 01/21/2024   CL 103 01/21/2024   CO2 30 01/21/2024   BUN 21 01/21/2024   CREATININE 0.84 01/21/2024   GFR 87.35 01/21/2024   CALCIUM  9.5 01/21/2024   PROT 7.0 01/21/2024   ALBUMIN  4.6 01/21/2024   BILITOT 0.5 01/21/2024   ALKPHOS 66 01/21/2024   AST 23 01/21/2024   ALT 32 01/21/2024   ANIONGAP 11 05/18/2020   Last lipids Lab Results  Component Value Date   CHOL 222 (H)  01/21/2024   HDL 40.50 01/21/2024   LDLCALC 140 (H) 01/21/2024   LDLDIRECT 79.0 03/15/2021   TRIG 208.0 (H) 01/21/2024   CHOLHDL 5 01/21/2024   Last hemoglobin A1c Lab Results  Component Value Date   HGBA1C 6.8 (A) 06/11/2024   HGBA1C 6.8 06/11/2024   HGBA1C 6.8 (A) 06/11/2024   HGBA1C 6.8 06/11/2024   IMPRESSION AND PLAN:  Diabetes without complication. POC Hba1c 6.8%.  This is technically at goal but it has risen from 5.9% about 4 months ago. He is frustrated with weight gain despite good diet and exercise. Will start Mounjaro  2.5 mg weekly. He knows that he may need to adjust his glipizide  dose downward as we get him onto the Mounjaro . BMET and urine  microalb/cr today.  An After Visit Summary was printed and given to the patient.  FOLLOW UP: Return for f/u approx 4 wks . Next cpe 01/2025 Signed:  Gerlene Hockey, MD           06/11/2024      [1] No Known Allergies

## 2024-06-12 LAB — BASIC METABOLIC PANEL WITH GFR
BUN: 19 mg/dL (ref 7–25)
CO2: 29 mmol/L (ref 20–32)
Calcium: 9.7 mg/dL (ref 8.6–10.3)
Chloride: 102 mmol/L (ref 98–110)
Creat: 0.93 mg/dL (ref 0.70–1.28)
Glucose, Bld: 109 mg/dL — ABNORMAL HIGH (ref 65–99)
Potassium: 4.7 mmol/L (ref 3.5–5.3)
Sodium: 138 mmol/L (ref 135–146)
eGFR: 87 mL/min/1.73m2

## 2024-06-12 LAB — LIPID PANEL
Cholesterol: 229 mg/dL — ABNORMAL HIGH
HDL: 37 mg/dL — ABNORMAL LOW
LDL Cholesterol (Calc): 142 mg/dL — ABNORMAL HIGH
Non-HDL Cholesterol (Calc): 192 mg/dL — ABNORMAL HIGH
Total CHOL/HDL Ratio: 6.2 (calc) — ABNORMAL HIGH
Triglycerides: 326 mg/dL — ABNORMAL HIGH

## 2024-06-12 LAB — TSH: TSH: 1.26 m[IU]/L (ref 0.40–4.50)

## 2024-06-12 LAB — MICROALBUMIN / CREATININE URINE RATIO
Creatinine, Urine: 37 mg/dL (ref 20–320)
Microalb, Ur: <0.2 mg/dL

## 2024-06-13 ENCOUNTER — Ambulatory Visit: Payer: Self-pay | Admitting: Family Medicine

## 2024-06-13 ENCOUNTER — Encounter: Payer: Self-pay | Admitting: Family Medicine

## 2024-07-09 ENCOUNTER — Encounter: Payer: Self-pay | Admitting: Family Medicine

## 2024-07-09 ENCOUNTER — Ambulatory Visit: Admitting: Family Medicine

## 2024-07-09 VITALS — BP 134/81 | HR 71 | Temp 97.0°F | Ht 71.0 in | Wt 250.0 lb

## 2024-07-09 DIAGNOSIS — Z23 Encounter for immunization: Secondary | ICD-10-CM

## 2024-07-09 DIAGNOSIS — E119 Type 2 diabetes mellitus without complications: Secondary | ICD-10-CM

## 2024-07-09 MED ORDER — TIRZEPATIDE 5 MG/0.5ML ~~LOC~~ SOAJ: 5.0000 mg | SUBCUTANEOUS | 0 refills | Status: AC

## 2024-07-09 NOTE — Progress Notes (Signed)
 OFFICE VISIT  07/09/2024  CC:  Chief Complaint  Patient presents with   Medical Management of Chronic Issues    Pt is taking Mounjaro  but reports no real weight loss and appetite has not changed.    Patient is a 74 y.o. male who presents for 1 month follow-up diabetes. A/P as of last visit: Diabetes without complication. POC Hba1c 6.8%.  This is technically at goal but it has risen from 5.9% about 4 months ago. He is frustrated with weight gain despite good diet and exercise. Will start Mounjaro  2.5 mg weekly. He knows that he may need to adjust his glipizide  dose downward as we get him onto the Mounjaro . BMET and urine microalb/cr today.  INTERIM HX: David Pearson feels well. He has a little bit of constipation from the Mounjaro  but no nausea.  He has continued to take his glipizide  10 mg twice daily. Says he has not had any significant hyperglycemia or hypoglycemia.  Past Medical History:  Diagnosis Date   Abdominal aortic ectasia 05/15/2018   AAA screening 05/2018, due for repeat US  in 5 years 05/2023   Abdominal wall abscess    not long after having incisional hernia repair-->abscess drained. Recurrence 07/2023->drained.   Diabetes mellitus without complication (HCC)    type 2, no meds   Diverticulosis    on colonoscopy 2020   ED (erectile dysfunction)    Gastric polyps    GERD (gastroesophageal reflux disease) 1995   Hearing loss 02/25/2019   bilateral - [facility]   History of iron  deficiency anemia    2020.  PO iron  x 1 yr.  Hemoccults 1/3 pos 12/2019 (when Hb and iron  back to normal)->GI->gastric polyps on EGD.   Hx of adenomatous polyp of colon 2020   sessile serrated polyp x 1->recall 5 yrs (Dr. Leigh)   Hyperlipidemia    Intra-abdominal abscess post-procedure (HCC) 2022   not long after having incisional hernia repair-->abscess drained.  Recurrence 07/2023   OSA (obstructive sleep apnea)    not on CPAP   Osteoarthritis of left knee    Hx of several steroid  injections and these helped, meloxicam prn as of 12/2019   Trigger thumb of left hand 2021/2022   inj x 3 by ortho    Past Surgical History:  Procedure Laterality Date   ABDOMINAL HERNIA REPAIR  03/12/2018   abd wall hernia repair x 2   ABDOMINOPLASTY     BIOPSY  07/26/2020   Procedure: BIOPSY;  Surgeon: Wilhelmenia Aloha Raddle., MD;  Location: [facility];  Service: Gastroenterology;;   cataracts     CHOLECYSTECTOMY N/A 03/12/2018   Procedure: LAPAROSCOPIC CHOLECYSTECTOMY;  Surgeon: Ebbie Cough, MD;  Location: [facility] OR;  Service: General;  Laterality: N/A;   COLONOSCOPY  06/12/2018   2020 sessile serrated polyp.  10/2023 adenoma x 1-->recall 5 yrs.   COSMETIC SURGERY  Abdomalplasty   ENDOSCOPIC MUCOSAL RESECTION N/A 07/26/2020   Procedure: ENDOSCOPIC MUCOSAL RESECTION;  Surgeon: Wilhelmenia Aloha Raddle., MD;  Location: [facility];  Service: Gastroenterology;  Laterality: N/A;   ESOPHAGOGASTRODUODENOSCOPY (EGD) WITH PROPOFOL  N/A 07/26/2020   Procedure: ESOPHAGOGASTRODUODENOSCOPY (EGD) WITH PROPOFOL ;  Surgeon: Wilhelmenia Aloha Raddle., MD;  Location: [facility];  Service: Gastroenterology;  Laterality: N/A;   EUS N/A 07/26/2020   Procedure: UPPER ENDOSCOPIC ULTRASOUND (EUS) RADIAL;  Surgeon: Wilhelmenia Aloha Raddle., MD;  Location: [facility];  Service: Gastroenterology;  Laterality: N/A;   EYE SURGERY  2015   Cataract/lens replacement   HEMOSTASIS CLIP PLACEMENT  07/26/2020   Procedure:  HEMOSTASIS CLIP PLACEMENT;  Surgeon: Mansouraty, Aloha Raddle., MD;  Location: [facility];  Service: Gastroenterology;;   HERNIA REPAIR     3 inguinal hernias   INCISIONAL HERNIA REPAIR N/A 05/18/2020   Procedure: INCISIONAL HERNIA REPAIR;  Surgeon: Ebbie Cough, MD;  Location: [facility] OR;  Service: General;  Laterality: N/A;   INSERTION OF MESH N/A 05/18/2020   Procedure: INSERTION OF MESH;  Surgeon: Ebbie Cough, MD;  Location: [facility] OR;  Service: General;  Laterality: N/A;   IR  RADIOLOGIST EVAL & MGMT  09/13/2020   LAPAROSCOPY N/A 05/18/2020   Procedure: LAPAROSCOPY DIAGNOSTIC;  Surgeon: Ebbie Cough, MD;  Location: [facility] OR;  Service: General;  Laterality: N/A;   POLYPECTOMY  07/26/2020   gastric polypectomy. Procedure: POLYPECTOMY;  Surgeon: Wilhelmenia Aloha Raddle., MD;  Location: [facility];  Service: Gastroenterology;;   MATIAS  07/26/2020   Procedure: MATIAS;  Surgeon: Wilhelmenia Aloha Raddle., MD;  Location: [facility];  Service: Gastroenterology;;   TONSILLECTOMY     with adenoids    Outpatient Medications Prior to Visit  Medication Sig Dispense Refill   blood glucose meter kit and supplies KIT Check glucose every morning and every afternoon approx 2 hrs after a meal 1 each 0   glipiZIDE  (GLUCOTROL ) 10 MG tablet Take 1 tablet (10 mg total) by mouth 2 (two) times daily before a meal. 180 tablet 3   tirzepatide  (MOUNJARO ) 2.5 MG/0.5ML Pen Inject 2.5 mg into the skin once a week. 2 mL 0   No facility-administered medications prior to visit.    Allergies[1]  Review of Systems As per HPI  PE:    07/09/2024   10:32 AM 06/11/2024    2:45 PM 03/19/2024    8:40 AM  Vitals with BMI  Height 5' 11    Weight 250 lbs 251 lbs 13 oz 242 lbs  BMI 34.88    Systolic 134 143   Diastolic 81 81   Pulse 71 70    Physical Exam  Gen: Alert, well appearing.  Patient is oriented to person, place, time, and situation. AFFECT: pleasant, lucid thought and speech. No further exam today  LABS:  Last metabolic panel Lab Results  Component Value Date   GLUCOSE 109 (H) 06/11/2024   NA 138 06/11/2024   K 4.7 06/11/2024   CL 102 06/11/2024   CO2 29 06/11/2024   BUN 19 06/11/2024   CREATININE 0.93 06/11/2024   EGFR 87 06/11/2024   CALCIUM  9.7 06/11/2024   PROT 7.0 01/21/2024   ALBUMIN  4.6 01/21/2024   BILITOT 0.5 01/21/2024   ALKPHOS 66 01/21/2024   AST 23 01/21/2024   ALT 32 01/21/2024   ANIONGAP 11 05/18/2020   Last hemoglobin A1c Lab  Results  Component Value Date   HGBA1C 6.8 (A) 06/11/2024   HGBA1C 6.8 06/11/2024   HGBA1C 6.8 (A) 06/11/2024   HGBA1C 6.8 06/11/2024   IMPRESSION AND PLAN:  Diabetes without complication, doing well with treatment on Mounjaro  2.5 mg weekly. Increase to 5 mg weekly. He will continue to monitor sugars and we reviewed possible decrease of glipizide  to avoid hypoglycemia. Next A1c due around  09/09/2024.  An After Visit Summary was printed and given to the patient.  FOLLOW UP: Return in about 2 months (around 09/06/2024) for routine chronic illness f/u. Next CPE 01/2025 Signed:  Gerlene Hockey, MD           07/09/2024     [1] No Known Allergies
